# Patient Record
Sex: Male | Born: 1960 | Race: White | Hispanic: No | Marital: Married | Smoking: Former smoker
Health system: Southern US, Community
[De-identification: ages and names within clinical notes are randomized; demographics above are authoritative.]

## PROBLEM LIST (undated history)

## (undated) DIAGNOSIS — I10 Essential (primary) hypertension: Secondary | ICD-10-CM

## (undated) DIAGNOSIS — K219 Gastro-esophageal reflux disease without esophagitis: Secondary | ICD-10-CM

## (undated) DIAGNOSIS — N200 Calculus of kidney: Secondary | ICD-10-CM

## (undated) DIAGNOSIS — M1712 Unilateral primary osteoarthritis, left knee: Secondary | ICD-10-CM

## (undated) DIAGNOSIS — T7840XA Allergy, unspecified, initial encounter: Secondary | ICD-10-CM

## (undated) DIAGNOSIS — E785 Hyperlipidemia, unspecified: Secondary | ICD-10-CM

## (undated) DIAGNOSIS — J189 Pneumonia, unspecified organism: Secondary | ICD-10-CM

## (undated) DIAGNOSIS — M1990 Unspecified osteoarthritis, unspecified site: Secondary | ICD-10-CM

## (undated) HISTORY — DX: Essential (primary) hypertension: I10

## (undated) HISTORY — DX: Gastro-esophageal reflux disease without esophagitis: K21.9

## (undated) HISTORY — DX: Allergy, unspecified, initial encounter: T78.40XA

## (undated) HISTORY — DX: Hyperlipidemia, unspecified: E78.5

## (undated) HISTORY — DX: Unspecified osteoarthritis, unspecified site: M19.90

## (undated) HISTORY — DX: Calculus of kidney: N20.0

## (undated) HISTORY — PX: CARDIAC CATHETERIZATION: SHX172

## (undated) HISTORY — PX: FOOT NEUROMA SURGERY: SHX646

---
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
## 1979-12-28 HISTORY — PX: NASAL SEPTUM SURGERY: SHX37

## 1997-12-16 ENCOUNTER — Encounter: Payer: Self-pay | Admitting: Gastroenterology

## 2003-02-05 ENCOUNTER — Encounter: Admission: RE | Admit: 2003-02-05 | Discharge: 2003-02-05 | Payer: Self-pay | Admitting: Chiropractic Medicine

## 2003-02-05 ENCOUNTER — Encounter: Payer: Self-pay | Admitting: Chiropractic Medicine

## 2003-02-06 ENCOUNTER — Encounter: Admission: RE | Admit: 2003-02-06 | Discharge: 2003-02-06 | Payer: Self-pay | Admitting: Chiropractic Medicine

## 2003-02-06 ENCOUNTER — Encounter: Payer: Self-pay | Admitting: Chiropractic Medicine

## 2004-03-14 ENCOUNTER — Observation Stay (HOSPITAL_COMMUNITY): Admission: AD | Admit: 2004-03-14 | Discharge: 2004-03-15 | Payer: Self-pay | Admitting: *Deleted

## 2004-06-08 ENCOUNTER — Observation Stay (HOSPITAL_COMMUNITY): Admission: EM | Admit: 2004-06-08 | Discharge: 2004-06-09 | Payer: Self-pay | Admitting: Emergency Medicine

## 2004-06-09 ENCOUNTER — Encounter (INDEPENDENT_AMBULATORY_CARE_PROVIDER_SITE_OTHER): Payer: Self-pay | Admitting: *Deleted

## 2005-12-27 HISTORY — PX: APPENDECTOMY: SHX54

## 2006-11-19 ENCOUNTER — Emergency Department (HOSPITAL_COMMUNITY): Admission: EM | Admit: 2006-11-19 | Discharge: 2006-11-19 | Payer: Self-pay | Admitting: Emergency Medicine

## 2007-10-02 ENCOUNTER — Ambulatory Visit: Payer: Self-pay | Admitting: Gastroenterology

## 2008-03-05 DIAGNOSIS — K219 Gastro-esophageal reflux disease without esophagitis: Secondary | ICD-10-CM

## 2008-03-05 DIAGNOSIS — K648 Other hemorrhoids: Secondary | ICD-10-CM | POA: Insufficient documentation

## 2008-03-05 DIAGNOSIS — J309 Allergic rhinitis, unspecified: Secondary | ICD-10-CM

## 2008-03-05 DIAGNOSIS — E7800 Pure hypercholesterolemia, unspecified: Secondary | ICD-10-CM | POA: Insufficient documentation

## 2008-03-05 DIAGNOSIS — E785 Hyperlipidemia, unspecified: Secondary | ICD-10-CM | POA: Insufficient documentation

## 2009-03-07 ENCOUNTER — Ambulatory Visit: Payer: Self-pay | Admitting: Diagnostic Radiology

## 2009-03-07 ENCOUNTER — Emergency Department (HOSPITAL_BASED_OUTPATIENT_CLINIC_OR_DEPARTMENT_OTHER): Admission: EM | Admit: 2009-03-07 | Discharge: 2009-03-08 | Payer: Self-pay | Admitting: Emergency Medicine

## 2009-03-13 ENCOUNTER — Emergency Department (HOSPITAL_BASED_OUTPATIENT_CLINIC_OR_DEPARTMENT_OTHER): Admission: EM | Admit: 2009-03-13 | Discharge: 2009-03-13 | Payer: Self-pay | Admitting: Emergency Medicine

## 2009-03-13 ENCOUNTER — Ambulatory Visit: Payer: Self-pay | Admitting: Interventional Radiology

## 2010-02-11 ENCOUNTER — Emergency Department (HOSPITAL_COMMUNITY): Admission: EM | Admit: 2010-02-11 | Discharge: 2010-02-11 | Payer: Self-pay | Admitting: Emergency Medicine

## 2011-03-17 LAB — URINALYSIS, ROUTINE W REFLEX MICROSCOPIC
Ketones, ur: NEGATIVE mg/dL
Protein, ur: NEGATIVE mg/dL
Urobilinogen, UA: 0.2 mg/dL (ref 0.0–1.0)

## 2011-03-17 LAB — URINE MICROSCOPIC-ADD ON

## 2011-04-08 LAB — BASIC METABOLIC PANEL
BUN: 17 mg/dL (ref 6–23)
Calcium: 9.1 mg/dL (ref 8.4–10.5)
Chloride: 101 mEq/L (ref 96–112)
GFR calc Af Amer: >60 mL/min (ref >60)
GFR calc non Af Amer: >60 mL/min (ref >60)
GFR calc non Af Amer: >60 mL/min (ref >60)
Glucose, Bld: 167 mg/dL — ABNORMAL HIGH (ref 70–99)
Glucose, Bld: 143 mg/dL — ABNORMAL HIGH (ref 70–99)
Potassium: 3.1 mEq/L — ABNORMAL LOW (ref 3.5–5.1)
Potassium: 3.8 mEq/L (ref 3.5–5.1)
Sodium: 140 mEq/L (ref 135–145)
Sodium: 141 mEq/L (ref 135–145)

## 2011-04-08 LAB — URINALYSIS, ROUTINE W REFLEX MICROSCOPIC
Bilirubin Urine: NEGATIVE
Bilirubin Urine: NEGATIVE
Glucose, UA: NEGATIVE mg/dL
Ketones, ur: NEGATIVE mg/dL
Nitrite: NEGATIVE
Protein, ur: NEGATIVE mg/dL
Specific Gravity, Urine: 1.021 (ref 1.005–1.030)
Urobilinogen, UA: 0.2 mg/dL (ref 0.0–1.0)
pH: 6.5 (ref 5.0–8.0)
pH: 5.5 (ref 5.0–8.0)

## 2011-04-08 LAB — CBC
HCT: 42.8 % (ref 39.0–52.0)
HCT: 45.6 % (ref 39.0–52.0)
Hemoglobin: 14.6 g/dL (ref 13.0–17.0)
Hemoglobin: 15.6 g/dL (ref 13.0–17.0)
MCV: 91.3 fL (ref 78.0–100.0)
Platelets: 251 10*3/uL (ref 150–400)
RBC: 4.68 MIL/uL (ref 4.22–5.81)
RDW: 11.8 % (ref 11.5–15.5)
WBC: 17.1 10*3/uL — ABNORMAL HIGH (ref 4.0–10.5)
WBC: 9.9 10*3/uL (ref 4.0–10.5)

## 2011-04-08 LAB — DIFFERENTIAL
Basophils Absolute: 0.2 10*3/uL — ABNORMAL HIGH (ref 0.0–0.1)
Eosinophils Absolute: 0 10*3/uL (ref 0.0–0.7)
Eosinophils Relative: 0 % (ref 0–5)
Eosinophils Relative: 0 % (ref 0–5)
Lymphocytes Relative: 14 % (ref 12–46)
Lymphocytes Relative: 12 % (ref 12–46)
Lymphs Abs: 2.4 10*3/uL (ref 0.7–4.0)
Lymphs Abs: 1.2 10*3/uL (ref 0.7–4.0)
Monocytes Absolute: 0.5 10*3/uL (ref 0.1–1.0)
Monocytes Relative: 4 % (ref 3–12)
Neutro Abs: 13.8 10*3/uL — ABNORMAL HIGH (ref 1.7–7.7)

## 2011-04-08 LAB — URINE MICROSCOPIC-ADD ON

## 2011-05-11 NOTE — Assessment & Plan Note (Signed)
[facility]                         GASTROENTEROLOGY OFFICE NOTE   [name], [name]                      MRN:          [id number]  DATE:10/02/2007                            DOB:          [birth year]    PRIMARY CARE PHYSICIAN:  Brandon King, M.D.   GI PHYSICIAN:  Brandon Lick. Russella Dar, MD, Brandon King   PROBLEM:  Rectal pain.   HISTORY:  Brandon King is a pleasant 50 year old white male, known remotely to  Dr.  Russella King, who apparently had colonoscopy in 1998. I do not have copy  of those records available today. His current problem is that of acute  rectal pain. He said he woke up in the middle of the night last night  with abdominal cramping and discomfort and then had a couple of bowel  movements, went back to sleep and had some rectal discomfort, further  bowel movements which were somewhat hard and then developed fairly  severe rectal pain. He said he soaked in the tub from 8 o'clock to 10  o'clock this morning, but has been quite uncomfortable. He said it hurts  to sit and to walk and obviously especially with bowel movements. He has  not noted any melena or hematochezia. He says that he has a protrusion  from his rectum which is the size of his finger and quite tender.   CURRENT MEDICATIONS:  1. Altace 10 mg daily.  2. Vytorin 10/40 daily.  3. Mobic p.r.n.   ALLERGIES:  SULFA WHICH CAUSES BLISTERS.   PAST MEDICAL HISTORY:  1. Hyperlipidemia.  2. He had an appendectomy about five years ago.  3. Repair of a deviated septum in 1981.  4. Tonsillectomy in 1970.   SOCIAL HISTORY:  The patient is married. Lives with his wife and son. He  is a Warehouse manager. He is a nonsmoker. Drinks alcohol minimally  socially, 6 pack per month if that.   REVIEW OF SYSTEMS:  Pertinent for allergy sinus symptoms. Arthritic  symptoms. GI as outlined above and otherwise review of systems is  completely negative.   PHYSICAL EXAMINATION:  Well-developed white male in  no acute distress.  Height is 5 feet, 10 inches. Weight is 188. Blood pressure 130/80, pulse  68.  HEENT: Unremarkable.  CARDIOVASCULAR: Regular rate and rhythm with S1 and S2.  PULMONARY: Clear to A&P.  ABDOMEN: Soft, nontender. Bowel sounds are active.  RECTAL: Reveals a swollen external hemorrhoid acutely inflamed, but not  definitely thrombosed.   IMPRESSION:  Acute rectal pain secondary to external hemorrhoid, acute.   PLAN:  1. Sitz baths four times daily over the next couple of days.  2. Analpram cream 2.5% at least q.i.d.  3. Stool softener samples given of Colace for use over the next one      week p.r.n.  4. Vicodin 5/500 one every 6 hours as needed for pain.  5. The patient was asked to call back in 24 hours if his symptoms have      not significantly improved, will refer to surgery for treatment of      thrombosed hemorrhoid.  Brandon Gip, PA-C  Electronically Signed      Brandon King. Arlyce Dice, MD,FACG  Electronically Signed   AE/MedQ  DD: 10/02/2007  DT: 10/02/2007  Job #: [id number]

## 2011-05-14 NOTE — H&P (Signed)
NAME:  Brandon King, Brandon King NO.:  [id number]   MEDICAL RECORD NO.:  [id number]                   PATIENT TYPE:  INP   LOCATION:  1824                                 FACILITY:  [facility]   PHYSICIAN:  Gabrielle Dare. Janee Morn, M.D.             DATE OF BIRTH:  [birth year]   DATE OF ADMISSION:  06/08/2004  DATE OF DISCHARGE:                                HISTORY & PHYSICAL   CHIEF COMPLAINT:  Right lower quadrant pain.   HISTORY OF PRESENT ILLNESS:  The patient is a 50 year old white male, with a  history of hypertension, who complains of right lower quadrant abdominal  pain since 1 p.m.  He describes it starting as dull but has become  progressively more sharp.  It remains localized to his right lower quadrant.  He notes a very similar episode eight years ago for which he was evaluated  by Dr. Joni Fears D. Young from our practice.  That episode was self-limiting  and it resolved without any surgery.  He was advised at that time if it  recurred he would need his appendix removed.  Now, the patient continues to  complain of some sharp pain in his right lower quadrant.   Workup in the [facility] Emergency Department included a  white blood cell count of 15.6 and CT scan of the abdomen and pelvis which  is consistent with acute appendicitis.   PAST MEDICAL HISTORY:  1. Hypertension.  2. Hypercholesterolemia.   PAST SURGICAL HISTORY:  1. Left wrist surgery.  2. tonsillectomy.  3. Cardiac catheterization which demonstrated no significant occlusion.   MEDICATIONS:  1. Altace 10 mg p.o. q.d.  2. Vytorin for cholesterol.   ALLERGIES:  SULFA and CODEINE.   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  He drinks about two  beers per week.   REVIEW OF SYMPTOMS:  GENERAL:  Negative for except for as mentioned.  PULMONARY:  Negative.  GASTROINTESTINAL:  See the history of present  illness.  GENITOURINARY:  Negative.  MUSCULOSKELETAL:  Negative.   The  remainder of the review of systems is negative.   PHYSICAL EXAMINATION:  VITAL SIGNS:  Blood pressure 125/73, temperature  97.8, pulse 67, respirations 18.  GENERAL:  He is awake, alert, and well-appearing.  HEENT:  Pupils are equally reactive.  Sclerae is nonicteric.  NECK:  Supple with no masses.  LUNGS:  Clear to auscultation with normal respiratory excursion.  HEART:  Regular rate and rhythm.  PMI is palpable on the left chest.  PULSES:  Distal pulses are 2+ and equal bilaterally.  ABDOMEN:  Soft and nondistended.  He has a small easily reducible umbilical  hernia.  He has significant tenderness with voluntary guarding in his right  lower quadrant.  No other  masses are noted.  There is no  hepatosplenomegaly.  SKIN:  Warm, dry and intact.  EXTREMITIES:  No  significant edema.   LABORATORY DATA:  White blood cell count 15.6, hemoglobin 14.1, hematocrit  41, platelets 259,000.  Sodium 139, potassium 3.6, chloride 104, BUN 14,  glucose 81.  Urinalysis was unremarkable except for some ketones present.  CT scan was as described above.   IMPRESSION:  Acute appendicitis.   PLAN:  The plan will be to take the patient to the operating room for  laparoscopic appendectomy.  The procedure, risks, and benefits were  discussed in detail with the patient who agrees to proceed.  He wife is also  present and we will give him some antibiotics preoperatively.                                                Gabrielle Dare Janee Morn, M.D.    BET/MEDQ  D:  06/08/2004  T:  06/08/2004  Job:  [id number]

## 2011-05-14 NOTE — Discharge Summary (Signed)
NAME:  RAZIEL, KOENIGS                         ACCOUNT NO.:  [id number]   MEDICAL RECORD NO.:  [id number]                   PATIENT TYPE:  INP   LOCATION:  5009                                 FACILITY:  [facility]   PHYSICIAN:  Gabrielle Dare. Janee Morn, M.D.             DATE OF BIRTH:  17-Oct-1961   DATE OF ADMISSION:  06/08/2004  DATE OF DISCHARGE:  06/09/2004                                 DISCHARGE SUMMARY   DISCHARGE DIAGNOSES:  1. Acute appendicitis.  2. Status post laparoscopic appendectomy.   HISTORY OF PRESENT ILLNESS:  The patient is a 50 year old white male who  presented to the emergency department last night with complaint of right  lower quadrant pain since 1 p.m. yesterday.  He was evaluated in the  emergency department and workup was consistent with acute appendicitis.  He  was admitted and taken to the operating room.   HOSPITAL COURSE:  The patient underwent an uncomplicated laparoscopic  appendectomy.  His appendix was not perforated.  Postoperatively he remained  afebrile, hemodynamically stable.  He had good control of his pain and  tolerated gradual advancement of his diet and he is discharged home in  stable condition on postoperative day #1.   DIET:  Regular.   ACTIVITY:  No lifting for six weeks.   DISCHARGE MEDICATIONS:  1. Percocet 5/325 one to two p.o. q.6h. p.r.n. pain.  2. Patient is also to continue  his home medications including:     a. Altace 10 mg p.o. daily.     b. Cholesterol medication.   FOLLOW UP:  Follow-up is with myself in three weeks.                                                Gabrielle Dare Janee Morn, M.D.    BET/MEDQ  D:  06/09/2004  T:  06/09/2004  Job:  [id number]

## 2011-05-14 NOTE — H&P (Signed)
NAME:  Brandon King, Brandon King                         ACCOUNT NO.:  [id number]   MEDICAL RECORD NO.:  [id number]                   PATIENT TYPE:  INP   LOCATION:  3034                                 FACILITY:  [facility]   PHYSICIAN:  Evelena Peat, M.D.               DATE OF BIRTH:  [birth year]   DATE OF ADMISSION:  03/14/2004  DATE OF DISCHARGE:  03/15/2004                                HISTORY & PHYSICAL   PRIMARY CARE PHYSICIAN:  Marjory Lies, M.D.   CHIEF COMPLAINT:  I passed out.   HISTORY OF PRESENT ILLNESS:  This is a 50 year old married white male with a  history of hypertension and hyperlipidemia, who was doing well until around  2 a.m. today when he had the onset of nausea and vomiting.  Around 2:30  a.m., he went to the bathroom to have a bowel movement.  While sitting on  the toilet, he became diaphoretic, nauseated, and felt dizzy and  subsequently had a syncopal episode.  His wife heard the commotion and found  him on the bathroom floor bleeding from a facial laceration.  At that point,  he was conscious and verbal.  There had been no reported stool or urinary  incontinence.  He was transported by EMS here for further evaluation and  noted to be orthostatic.  Head CT was obtained, which was negative.  Patient  received approximately 1.5 liters of IV fluids with normal saline and was  being prepared for discharge when he stood up and became extremely  orthostatic with blood pressure dropping to 80/30.  He was now admitted for  further observation and fluids.  He has no history of any seizure disorder  and has not had any recent complaints of chest pain.   PAST MEDICAL HISTORY:  1. Hypertension.  2. Hyperlipidemia.   ALLERGIES:  SULFA.   MEDICATIONS:  1. Altace 10 mg a day.  2. Vitorin 40/10 1 a day.   PAST SURGICAL HISTORY:  1. Tonsillectomy.  2. Left wrist surgery.  3. Deviated nasal septal surgery in 1981.   SOCIAL HISTORY:  He is married.  He has a 18-month-old  adopted son.  He  smokes cigars.  Rare alcohol use.  Works as a Nutritional therapist.   FAMILY HISTORY:  Mother died at age 73 of complications of coronary disease.  Father died at age 52 of coronary disease.  He had a paternal grandfather  who died at age 87 of coronary disease.  He had a brother who died at age 35  of coronary disease.   REVIEW OF SYSTEMS:  He has had one episode of nonbloody diarrhea since  arrival here.  He had a headache initially after the fall but none now.  He  denies any recent sore throat, cough, dyspnea, chest pain, abdominal pain,  or dysuria.   OBJECTIVE:  VITAL SIGNS:  Temperature 99.4, blood pressure supine 123/65  with pulse of  75.  Standing blood pressure 111/60 and pulse of 87.  Respirations are 16-18.  GENERAL:  Patient is alert in no apparent distress.  Lying supine.  HEENT:  He has a laceration to the right frontal area, which was glued by  the ED staff.  He appears to have a small, soft hematoma underlying the  laceration but no evidence for head trauma otherwise.  Pupils are equal,  round and reactive to light.  TMs are normal.  Oropharynx is moist and  clear.  NECK:  Supple without mass.  No spinal tenderness.  LUNGS:  Clear to auscultation.  HEART:  Regular rate and rhythm without murmur.  ABDOMEN:  Normal bowel sounds.  Soft, nontender without hepatosplenomegaly.  EXTREMITIES:  Pulses 2+ throughout.  No edema.  NEUROLOGIC:  Cranial nerves II-XII are normal.  He has no focal strength  deficits.   LABS:  White count is 14.4 thousand with 88% neutrophils.  Hemoglobin 15.1.  CK-MB and troponin levels were normal.  Sodium 140, potassium 4.0.   CT of the head is normal.   EKG:  No acute changes.   IMPRESSION:  A 50 year old white male with probable viral gastroenteritis.  Suspect this syncopal episode is related to vasovagal episode and  orthostatic hypotension.  There is no suspicion for seizure activity or  cardiac arrhythmia.  He has remained  orthostatic, though improved after two  liters of IV fluids.   PLAN:  Would admit for further IV fluids and antiemetics p.r.n.  We will  hold his Altace.  Will continue to monitor orthostatic blood pressures and  make sure that he is able to fully ambulate before discharge.  Hopefully, he  will be a 24-hour observation admission.                                                Evelena Peat, M.D.    BB/MEDQ  D:  03/14/2004  T:  03/16/2004  Job:  [id number]   cc:   Marjory Lies, M.D.  [address]  [place]  [place] [address]  Fax: [phone]

## 2011-05-14 NOTE — Op Note (Signed)
NAME:  SAMANTHA, OLIVERA                         ACCOUNT NO.:  [id number]   MEDICAL RECORD NO.:  [id number]                   PATIENT TYPE:  INP   LOCATION:  5009                                 FACILITY:  [facility]   PHYSICIAN:  Gabrielle Dare. Janee Morn, M.D.             DATE OF BIRTH:  1961/12/11   DATE OF PROCEDURE:  06/09/2004  DATE OF DISCHARGE:                                 OPERATIVE REPORT   PREOPERATIVE DIAGNOSIS:  Acute appendicitis.   POSTOPERATIVE DIAGNOSES:  Acute appendicitis.   PROCEDURE:  Laparoscopic appendectomy.   SURGEON:  Violeta Gelinas, M.D.   ANESTHESIA:  General.   HISTORY OF PRESENT ILLNESS:  The patient is a 50 year old white male, who  presented to the emergency department with complaints of right lower  quadrant pain similar to an episode of self-limiting appendicitis that he  had 8 years ago.  Work-up in the emergency department included white blood  cell count which was 15.6 and CT scan which was most consistent with acute  appendicitis.  His physical exam concurred, and he is brought to the  operating room for an appendectomy.   PROCEDURE IN DETAIL:  Informed consent was obtained.  The patient received  intravenous antibiotics.  He was brought up to the operating room.  General  anesthesia was administered.  His abdomen was prepped and draped in a  sterile fashion.  Of note, the patient also has small, spontaneously  reducing umbilical hernia.  An infraumbilical incision was made.  Subcutaneous tissue was dissected down.  His umbilical hernia was  circumferentially dissected, and the omentum which it contained was reduced  back into the abdominal cavity.  The peritoneal cavity was then entered  under direct vision without difficulty.  A 0 Vicryl pursestring suture was  placed around the fascial opening.  The Hasson trocar was inserted through  the hernia defect, and the abdomen was insufflated with carbon dioxide under  direct vision.  The 5 mm right upper  quadrant and a 12 mm left lower  quadrant trocars were inserted.  At this time, the cecum was retracted  medially.  The appendix was noted to be retrocecal and a bit lateral.  Some  of the lateral peritoneal attachments to the cecum were taken down with  care, and the appendix was revealed.  Once this was adequately dissected  free, the base was nicely visualized.  The mesoappendix was then dissected  away from the base, and the mesoappendix was divided with an endoscopic GIA  stapler with a vascular load.  This took 2 firings of the GIA and, at this  time, the base was cleared off, and then the base was divided with an  endoscopic GIA with a vascular load stapler.  The appendix was placed in an  EndoCatch bag and was acutely inflamed but not perforated, and it was  removed from the abdomen via the left lower quadrant port site.  The  abdomen  was copiously irrigated with 2 L of saline.  The staple lines were  rechecked.  Excellent hemostasis was ensured and subsequently the trocars  were removed under direct vision.  The Hasson was removed, and the  pneumoperitoneum was released.  The umbilical defect was first closed by  tying the 0 Vicryl pursestring suture.  Subsequently, further score to the  hernia repair was done with interrupted 0 Prolene sutures.  Once that was  accomplished, the umbilicus was tacked back down to the fascia with  interrupted 3-0 Vicryl sutures.  All three incisions were copiously  irrigated.  Marcaine 0.25% with epinephrine was used for local anesthetic,  and the skin region was closed with running 4-0 Vicryl  subcuticular stitch.  Sponge, needle, and instrument counts were correct.  Benzoin and Steri-Strips and sterile dressings were applied.  The patient  tolerated the procedure without apparent complication and was taken to the  recovery room in stable condition.                                               Gabrielle Dare Janee Morn, M.D.    BET/MEDQ  D:   06/09/2004  T:  06/09/2004  Job:  [id number]

## 2012-04-10 ENCOUNTER — Encounter: Payer: Self-pay | Admitting: Gastroenterology

## 2012-04-25 ENCOUNTER — Ambulatory Visit (AMBULATORY_SURGERY_CENTER): Payer: BC Managed Care – PPO | Admitting: *Deleted

## 2012-04-25 VITALS — Ht 69.5 in | Wt 176.0 lb

## 2012-04-25 DIAGNOSIS — Z1211 Encounter for screening for malignant neoplasm of colon: Secondary | ICD-10-CM

## 2012-04-25 MED ORDER — PEG-KCL-NACL-NASULF-NA ASC-C 100 G PO SOLR: ORAL | Status: DC

## 2012-05-09 ENCOUNTER — Ambulatory Visit (AMBULATORY_SURGERY_CENTER): Payer: BC Managed Care – PPO | Admitting: Gastroenterology

## 2012-05-09 ENCOUNTER — Encounter: Payer: Self-pay | Admitting: Gastroenterology

## 2012-05-09 VITALS — BP 146/71 | HR 59 | Temp 96.1°F | Resp 18 | Ht 69.0 in | Wt 176.0 lb

## 2012-05-09 DIAGNOSIS — Z1211 Encounter for screening for malignant neoplasm of colon: Secondary | ICD-10-CM

## 2012-05-09 MED ORDER — SODIUM CHLORIDE 0.9 % IV SOLN: 500.0000 mL | INTRAVENOUS | Status: DC

## 2012-05-09 NOTE — Op Note (Signed)
[facility] [address] [place], [place]  [address]  COLONOSCOPY PROCEDURE REPORT  PATIENT:  Brandon King, Brandon King  MR#:  [id number] BIRTHDATE:  29-Jul-1961, 51 yrs. old  GENDER:  male ENDOSCOPIST:  Judie Petit T. Russella Dar, MD, [facility]  PROCEDURE DATE:  05/09/2012 PROCEDURE:  Colonoscopy 78469 ASA CLASS:  Class II INDICATIONS:  1) Routine Risk Screening MEDICATIONS:   MAC sedation, administered by CRNA, propofol (Diprivan) 180 mg IV DESCRIPTION OF PROCEDURE:   After the risks benefits and alternatives of the procedure were thoroughly explained, informed consent was obtained.  Digital rectal exam was performed and revealed no abnormalities.   The LB CF-H180AL [id number] endoscope was introduced through the anus and advanced to the cecum, which was identified by both the appendix and ileocecal valve, without limitations.  The quality of the prep was excellent, using MoviPrep.  The instrument was then slowly withdrawn as the colon was fully examined. <<PROCEDUREIMAGES>> FINDINGS:  A normal appearing cecum, ileocecal valve, and appendiceal orifice were identified. The ascending, hepatic flexure, transverse, splenic flexure, descending, sigmoid colon, and rectum appeared unremarkable. Retroflexed views in the rectum revealed small internal hemorrhoids, hypertrophied anal papillae. The time to cecum =  1.75  minutes. The scope was then withdrawn (time =  8.75  min) from the patient and the procedure completed.  COMPLICATIONS:  None  ENDOSCOPIC IMPRESSION: 1) Normal colon 2) Internal hemorrhoids 3) Hypertrophied anal papillae  RECOMMENDATIONS: 1) Continue current colorectal screening for "routine risk" patients with a repeat colonoscopy in 10 years.  Venita Lick. Russella Dar, MD, [facility]  CC:  Marjory Lies, MD  n. Rosalie DoctorVenita Lick. Brieanna Nau at 05/09/2012 09:17 AM  [place] Ram, [phone]

## 2012-05-09 NOTE — Progress Notes (Signed)
Patient did not have preoperative order for IV antibiotic SSI prophylaxis. (G8918)  Patient did not experience any of the following events: a burn prior to discharge; a fall within the facility; wrong site/side/patient/procedure/implant event; or a hospital transfer or hospital admission upon discharge from the facility. (G8907)  

## 2012-05-09 NOTE — Patient Instructions (Signed)
YOU HAD AN ENDOSCOPIC PROCEDURE TODAY AT THE [facility]: Refer to the procedure report that was given to you for any specific questions about what was found during the examination.  If the procedure report does not answer your questions, please call your gastroenterologist to clarify.  If you requested that your care partner not be given the details of your procedure findings, then the procedure report has been included in a sealed envelope for you to review at your convenience later.  YOU SHOULD EXPECT: Some feelings of bloating in the abdomen. Passage of more gas than usual.  Walking can help get rid of the air that was put into your GI tract during the procedure and reduce the bloating. If you had a lower endoscopy (such as a colonoscopy or flexible sigmoidoscopy) you may notice spotting of blood in your stool or on the toilet paper. If you underwent a bowel prep for your procedure, then you may not have a normal bowel movement for a few days.  DIET: Your first meal following the procedure should be a light meal and then it is ok to progress to your normal diet.  A half-sandwich or bowl of soup is an example of a good first meal.  Heavy or fried foods are harder to digest and may make you feel nauseous or bloated.  Likewise meals heavy in dairy and vegetables can cause extra gas to form and this can also increase the bloating.  Drink plenty of fluids but you should avoid alcoholic beverages for 24 hours.  ACTIVITY: Your care partner should take you home directly after the procedure.  You should plan to take it easy, moving slowly for the rest of the day.  You can resume normal activity the day after the procedure however you should NOT DRIVE or use heavy machinery for 24 hours (because of the sedation medicines used during the test).    SYMPTOMS TO REPORT IMMEDIATELY: A gastroenterologist can be reached at any hour.  During normal business hours, 8:30 AM to 5:00 PM Monday through Friday,  call [phone].  After hours and on weekends, please call the GI answering service at [phone] who will take a message and have the physician on call contact you.   Following lower endoscopy (colonoscopy or flexible sigmoidoscopy):  Excessive amounts of blood in the stool  Significant tenderness or worsening of abdominal pains  Swelling of the abdomen that is new, acute  Fever of 100F or higher    FOLLOW UP: If any biopsies were taken you will be contacted by phone or by letter within the next 1-3 weeks.  Call your gastroenterologist if you have not heard about the biopsies in 3 weeks.  Our staff will call the home number listed on your records the next business day following your procedure to check on you and address any questions or concerns that you may have at that time regarding the information given to you following your procedure. This is a courtesy call and so if there is no answer at the home number and we have not heard from you through the emergency physician on call, we will assume that you have returned to your regular daily activities without incident.  SIGNATURES/CONFIDENTIALITY: You and/or your care partner have signed paperwork which will be entered into your electronic medical record.  These signatures attest to the fact that that the information above on your After Visit Summary has been reviewed and is understood.  Full responsibility of the confidentiality   of this discharge information lies with you and/or your care-partner.     

## 2012-05-10 ENCOUNTER — Telehealth: Payer: Self-pay | Admitting: *Deleted

## 2012-05-10 NOTE — Telephone Encounter (Signed)
  Follow up Call-  Call back number 05/09/2012  Post procedure Call Back phone  # [phone]  Permission to leave phone message Yes     Left message.

## 2013-07-10 ENCOUNTER — Ambulatory Visit: Payer: BC Managed Care – PPO | Admitting: Neurology

## 2014-04-01 ENCOUNTER — Inpatient Hospital Stay (HOSPITAL_COMMUNITY)
Admission: RE | Admit: 2014-04-01 | Discharge: 2014-04-01 | Disposition: A | Discharge status: Home / Self Care | Payer: BC Managed Care – PPO | Source: Ambulatory Visit

## 2014-04-01 NOTE — Pre-Procedure Instructions (Signed)
Brandon EvenerJames E King  04/01/2014   Your procedure is scheduled on:  Tuesday, April 14th  Report to Admitting at 0830 AM.  Call this number if you have problems the morning of surgery: [phone]   Remember:   Do not eat food or drink liquids after midnight.   Take these medicines the morning of surgery with A SIP OF WATER:    Do not wear jewelry.  Do not wear lotions, powders, or perfumes. You may wear deodorant.  Do not shave 48 hours prior to surgery. Men may shave face and neck.  Do not bring valuables to the hospital.  [facility] not responsible   for any belongings or valuables.               Contacts, dentures or bridgework may not be worn into surgery.  Leave suitcase in the car. After surgery it may be brought to your room.  For patients admitted to the hospital, discharge time is determined by your treatment team.               Patients discharged the day of surgery will not be allowed to drive home.  Please read over the following fact sheets that you were given: Pain Booklet, Coughing and Deep Breathing and Surgical Site Infection Prevention [facility] - Preparing for Surgery  Before surgery, you can play an important role.  Because skin is not sterile, your skin needs to be as free of germs as possible.  You can reduce the number of germs on you skin by washing with CHG (chlorahexidine gluconate) soap before surgery.  CHG is an antiseptic cleaner which kills germs and bonds with the skin to continue killing germs even after washing.  Please DO NOT use if you have an allergy to CHG or antibacterial soaps.  If your skin becomes reddened/irritated stop using the CHG and inform your nurse when you arrive at Short Stay.  Do not shave (including legs and underarms) for at least 48 hours prior to the first CHG shower.  You may shave your face.  Please follow these instructions carefully:   1.  Shower with CHG Soap the night before surgery and the morning of Surgery.  2.  If  you choose to wash your hair, wash your hair first as usual with your normal shampoo.  3.  After you shampoo, rinse your hair and body thoroughly to remove the shampoo.  4.  Use CHG as you would any other liquid soap.  You can apply CHG directly to the skin and wash gently with scrungie or a clean washcloth.  5.  Apply the CHG Soap to your body ONLY FROM THE NECK DOWN.  Do not use on open wounds or open sores.  Avoid contact with your eyes, ears, mouth and genitals (private parts).  Wash genitals (private parts) with your normal soap.  6.  Wash thoroughly, paying special attention to the area where your surgery will be performed.  7.  Thoroughly rinse your body with warm water from the neck down.  8.  DO NOT shower/wash with your normal soap after using and rinsing off the CHG Soap.  9.  Pat yourself dry with a clean towel.            10.  Wear clean pajamas.            11.  Place clean sheets on your bed the night of your first shower and do not sleep with pets.  Day  of Surgery  Do not apply any lotions/deoderants the morning of surgery.  Please wear clean clothes to the hospital/surgery center.

## 2014-04-04 ENCOUNTER — Other Ambulatory Visit: Payer: Self-pay | Admitting: Orthopedic Surgery

## 2014-04-08 ENCOUNTER — Encounter (HOSPITAL_COMMUNITY): Payer: Self-pay

## 2014-04-08 ENCOUNTER — Ambulatory Visit (HOSPITAL_COMMUNITY)
Admission: RE | Admit: 2014-04-08 | Discharge: 2014-04-08 | Disposition: A | Discharge status: Home / Self Care | Payer: BC Managed Care – PPO | Source: Ambulatory Visit | Attending: Anesthesiology | Admitting: Anesthesiology

## 2014-04-08 ENCOUNTER — Encounter (HOSPITAL_COMMUNITY)
Admission: RE | Admit: 2014-04-08 | Discharge: 2014-04-08 | Disposition: A | Discharge status: Home / Self Care | Payer: BC Managed Care – PPO | Source: Ambulatory Visit | Attending: Orthopedic Surgery | Admitting: Orthopedic Surgery

## 2014-04-08 DIAGNOSIS — Z01812 Encounter for preprocedural laboratory examination: Secondary | ICD-10-CM

## 2014-04-08 DIAGNOSIS — X58XXXA Exposure to other specified factors, initial encounter: Secondary | ICD-10-CM

## 2014-04-08 DIAGNOSIS — Z01818 Encounter for other preprocedural examination: Secondary | ICD-10-CM | POA: Insufficient documentation

## 2014-04-08 DIAGNOSIS — IMO0002 Reserved for concepts with insufficient information to code with codable children: Secondary | ICD-10-CM | POA: Insufficient documentation

## 2014-04-08 HISTORY — DX: Pneumonia, unspecified organism: J18.9

## 2014-04-08 LAB — BASIC METABOLIC PANEL
BUN: 18 mg/dL (ref 6–23)
CO2: 28 mEq/L (ref 19–32)
Calcium: 10 mg/dL (ref 8.4–10.5)
Chloride: 103 mEq/L (ref 96–112)
Creatinine, Ser: 1.08 mg/dL (ref 0.50–1.35)
GFR calc Af Amer: 88 mL/min — ABNORMAL LOW (ref >90)
GFR calc non Af Amer: 76 mL/min — ABNORMAL LOW (ref >90)
Glucose, Bld: 129 mg/dL — ABNORMAL HIGH (ref 70–99)
POTASSIUM: 4.1 meq/L (ref 3.7–5.3)
SODIUM: 144 meq/L (ref 137–147)

## 2014-04-08 LAB — SURGICAL PCR SCREEN
MRSA, PCR: NEGATIVE
STAPHYLOCOCCUS AUREUS: POSITIVE — AB

## 2014-04-08 LAB — TYPE AND SCREEN
ABO/RH(D): O POS
ANTIBODY SCREEN: NEGATIVE

## 2014-04-08 LAB — CBC
HCT: 42.3 % (ref 39.0–52.0)
HEMOGLOBIN: 14.8 g/dL (ref 13.0–17.0)
MCH: 31.6 pg (ref 26.0–34.0)
MCHC: 35 g/dL (ref 30.0–36.0)
MCV: 90.2 fL (ref 78.0–100.0)
Platelets: 226 10*3/uL (ref 150–400)
RBC: 4.69 MIL/uL (ref 4.22–5.81)
RDW: 12.6 % (ref 11.5–15.5)
WBC: 8.3 10*3/uL (ref 4.0–10.5)

## 2014-04-08 LAB — APTT: aPTT: 31 seconds (ref 24–37)

## 2014-04-08 LAB — PROTIME-INR
INR: 0.92 (ref 0.00–1.49)
Prothrombin Time: 12.2 seconds (ref 11.6–15.2)

## 2014-04-08 LAB — ABO/RH: ABO/RH(D): O POS

## 2014-04-08 MED ORDER — CEFAZOLIN SODIUM-DEXTROSE 2-3 GM-% IV SOLR
2.0000 g | INTRAVENOUS | Status: AC
  Administered 2014-04-09: 2 g via INTRAVENOUS
  Filled 2014-04-08: qty 50

## 2014-04-08 NOTE — Progress Notes (Signed)
Primary physician - dr. Doristine CounterBurnett Cardiologist - chris barber - [place] Stress echo - 3 years ago -- will attempt to request since surg tomorrow cath - 10 years ago

## 2014-04-08 NOTE — Progress Notes (Signed)
04/08/14 1508  OBSTRUCTIVE SLEEP APNEA  Have you ever been diagnosed with sleep apnea through a sleep study? No  Do you snore loudly (loud enough to be heard through closed doors)?  1  Do you often feel tired, fatigued, or sleepy during the daytime? 0  Has anyone observed you stop breathing during your sleep? 0  Do you have, or are you being treated for high blood pressure? 1  BMI more than 35 kg/m2? 0  Age over 55 years old? 1  Neck circumference greater than 40 cm/18 inches? 0  Gender: 1  Obstructive Sleep Apnea Score 4  Score 4 or greater  Results sent to PCP

## 2014-04-08 NOTE — Progress Notes (Signed)
Please treat pt DOS for positive Staph PCR result.

## 2014-04-09 ENCOUNTER — Encounter (HOSPITAL_COMMUNITY)
Admission: RE | Disposition: A | Discharge status: Home Health | Payer: Self-pay | Source: Ambulatory Visit | Attending: Orthopedic Surgery

## 2014-04-09 ENCOUNTER — Inpatient Hospital Stay (HOSPITAL_COMMUNITY): Payer: BC Managed Care – PPO | Admitting: Anesthesiology

## 2014-04-09 ENCOUNTER — Encounter (HOSPITAL_COMMUNITY): Payer: Self-pay | Admitting: *Deleted

## 2014-04-09 ENCOUNTER — Inpatient Hospital Stay (HOSPITAL_COMMUNITY): Payer: BC Managed Care – PPO

## 2014-04-09 ENCOUNTER — Inpatient Hospital Stay (HOSPITAL_COMMUNITY)
Admission: RE | Admit: 2014-04-09 | Discharge: 2014-04-11 | DRG: 470 | Disposition: A | Discharge status: Home Health | Payer: BC Managed Care – PPO | Source: Ambulatory Visit | Attending: Orthopedic Surgery | Admitting: Orthopedic Surgery

## 2014-04-09 ENCOUNTER — Encounter (HOSPITAL_COMMUNITY): Payer: BC Managed Care – PPO | Admitting: Anesthesiology

## 2014-04-09 DIAGNOSIS — Z01812 Encounter for preprocedural laboratory examination: Secondary | ICD-10-CM

## 2014-04-09 DIAGNOSIS — Z87891 Personal history of nicotine dependence: Secondary | ICD-10-CM

## 2014-04-09 DIAGNOSIS — M179 Osteoarthritis of knee, unspecified: Secondary | ICD-10-CM | POA: Diagnosis present

## 2014-04-09 DIAGNOSIS — M1712 Unilateral primary osteoarthritis, left knee: Secondary | ICD-10-CM

## 2014-04-09 DIAGNOSIS — E785 Hyperlipidemia, unspecified: Secondary | ICD-10-CM | POA: Diagnosis present

## 2014-04-09 DIAGNOSIS — Z79899 Other long term (current) drug therapy: Secondary | ICD-10-CM

## 2014-04-09 DIAGNOSIS — Z01818 Encounter for other preprocedural examination: Secondary | ICD-10-CM

## 2014-04-09 DIAGNOSIS — M1710 Unilateral primary osteoarthritis, unspecified knee: Principal | ICD-10-CM | POA: Diagnosis present

## 2014-04-09 DIAGNOSIS — Z01810 Encounter for preprocedural cardiovascular examination: Secondary | ICD-10-CM

## 2014-04-09 DIAGNOSIS — I10 Essential (primary) hypertension: Secondary | ICD-10-CM | POA: Diagnosis present

## 2014-04-09 HISTORY — PX: REPLACEMENT UNICONDYLAR JOINT KNEE: SUR1227

## 2014-04-09 HISTORY — PX: KNEE ARTHROSCOPY: SHX127

## 2014-04-09 HISTORY — DX: Unilateral primary osteoarthritis, left knee: M17.12

## 2014-04-09 HISTORY — PX: PARTIAL KNEE ARTHROPLASTY: SHX2174

## 2014-04-09 LAB — CBC
HCT: 38.8 % — ABNORMAL LOW (ref 39.0–52.0)
HEMOGLOBIN: 13.7 g/dL (ref 13.0–17.0)
MCH: 31.9 pg (ref 26.0–34.0)
MCHC: 35.3 g/dL (ref 30.0–36.0)
MCV: 90.2 fL (ref 78.0–100.0)
PLATELETS: 203 10*3/uL (ref 150–400)
RBC: 4.3 MIL/uL (ref 4.22–5.81)
RDW: 12.6 % (ref 11.5–15.5)
WBC: 13.6 10*3/uL — ABNORMAL HIGH (ref 4.0–10.5)

## 2014-04-09 LAB — CREATININE, SERUM
Creatinine, Ser: 0.94 mg/dL (ref 0.50–1.35)
GFR calc non Af Amer: >90 mL/min (ref >90)

## 2014-04-09 SURGERY — ARTHROSCOPY, KNEE
Anesthesia: General | Site: Knee | Laterality: Left

## 2014-04-09 MED ORDER — MIDAZOLAM HCL 2 MG/2ML IJ SOLN
INTRAMUSCULAR | Status: AC
  Filled 2014-04-09: qty 2

## 2014-04-09 MED ORDER — SENNA-DOCUSATE SODIUM 8.6-50 MG PO TABS: 2.0000 | ORAL_TABLET | Freq: Every day | ORAL | Status: DC

## 2014-04-09 MED ORDER — DEXAMETHASONE 6 MG PO TABS
10.0000 mg | ORAL_TABLET | Freq: Three times a day (TID) | ORAL | Status: AC
  Administered 2014-04-09 – 2014-04-10 (×2): 10 mg via ORAL
  Filled 2014-04-09 (×3): qty 1

## 2014-04-09 MED ORDER — ONDANSETRON HCL 4 MG PO TABS: 4.0000 mg | ORAL_TABLET | Freq: Four times a day (QID) | ORAL | Status: DC | PRN

## 2014-04-09 MED ORDER — BISACODYL 10 MG RE SUPP
10.0000 mg | Freq: Every day | RECTAL | Status: DC | PRN
  Filled 2014-04-09: qty 1

## 2014-04-09 MED ORDER — MIDAZOLAM HCL 2 MG/2ML IJ SOLN
1.0000 mg | Freq: Once | INTRAMUSCULAR | Status: AC
  Administered 2014-04-09: 1 mg via INTRAVENOUS

## 2014-04-09 MED ORDER — OXYCODONE HCL 5 MG PO TABS
ORAL_TABLET | ORAL | Status: AC
Start: 2014-04-09 — End: 2014-04-10
  Filled 2014-04-09: qty 2

## 2014-04-09 MED ORDER — CEFAZOLIN SODIUM-DEXTROSE 2-3 GM-% IV SOLR
2.0000 g | Freq: Four times a day (QID) | INTRAVENOUS | Status: AC
  Administered 2014-04-09 (×2): 2 g via INTRAVENOUS
  Filled 2014-04-09 (×3): qty 50

## 2014-04-09 MED ORDER — HYDROMORPHONE HCL PF 1 MG/ML IJ SOLN
INTRAMUSCULAR | Status: AC
Start: 2014-04-09 — End: 2014-04-10
  Filled 2014-04-09: qty 1

## 2014-04-09 MED ORDER — SODIUM CHLORIDE 0.9 % IR SOLN
Status: DC | PRN
  Administered 2014-04-09: 3000 mL

## 2014-04-09 MED ORDER — ENOXAPARIN SODIUM 30 MG/0.3ML ~~LOC~~ SOLN: 30.0000 mg | Freq: Two times a day (BID) | SUBCUTANEOUS | Status: DC

## 2014-04-09 MED ORDER — MENTHOL 3 MG MT LOZG: 1.0000 | LOZENGE | OROMUCOSAL | Status: DC | PRN

## 2014-04-09 MED ORDER — HYDROCHLOROTHIAZIDE 25 MG PO TABS
25.0000 mg | ORAL_TABLET | Freq: Every day | ORAL | Status: DC
  Administered 2014-04-09 – 2014-04-11 (×3): 25 mg via ORAL
  Filled 2014-04-09 (×3): qty 1

## 2014-04-09 MED ORDER — METOCLOPRAMIDE HCL 10 MG PO TABS: 5.0000 mg | ORAL_TABLET | Freq: Three times a day (TID) | ORAL | Status: DC | PRN

## 2014-04-09 MED ORDER — LIDOCAINE HCL (CARDIAC) 20 MG/ML IV SOLN
INTRAVENOUS | Status: DC | PRN
  Administered 2014-04-09: 100 mg via INTRAVENOUS

## 2014-04-09 MED ORDER — ACETAMINOPHEN 650 MG RE SUPP: 650.0000 mg | Freq: Four times a day (QID) | RECTAL | Status: DC | PRN

## 2014-04-09 MED ORDER — POTASSIUM CHLORIDE IN NACL 20-0.45 MEQ/L-% IV SOLN
INTRAVENOUS | Status: DC
  Administered 2014-04-09: 17:00:00 via INTRAVENOUS
  Filled 2014-04-09 (×5): qty 1000

## 2014-04-09 MED ORDER — MUPIROCIN 2 % EX OINT
TOPICAL_OINTMENT | Freq: Two times a day (BID) | CUTANEOUS | Status: DC
  Administered 2014-04-09: 09:00:00 via NASAL
  Filled 2014-04-09: qty 22

## 2014-04-09 MED ORDER — HYDROMORPHONE HCL 2 MG PO TABS
2.0000 mg | ORAL_TABLET | ORAL | Status: DC | PRN
Start: 2014-04-09 — End: 2014-10-28

## 2014-04-09 MED ORDER — METHOCARBAMOL 500 MG PO TABS: 500.0000 mg | ORAL_TABLET | Freq: Four times a day (QID) | ORAL | Status: DC

## 2014-04-09 MED ORDER — RAMIPRIL 10 MG PO CAPS
10.0000 mg | ORAL_CAPSULE | Freq: Every day | ORAL | Status: DC
  Administered 2014-04-09 – 2014-04-11 (×3): 10 mg via ORAL
  Filled 2014-04-09 (×3): qty 1

## 2014-04-09 MED ORDER — FENTANYL CITRATE 0.05 MG/ML IJ SOLN
INTRAMUSCULAR | Status: AC
  Administered 2014-04-09: 100 ug via INTRAVENOUS
  Filled 2014-04-09: qty 2

## 2014-04-09 MED ORDER — DOCUSATE SODIUM 100 MG PO CAPS
100.0000 mg | ORAL_CAPSULE | Freq: Two times a day (BID) | ORAL | Status: DC
  Administered 2014-04-09 – 2014-04-11 (×4): 100 mg via ORAL
  Filled 2014-04-09 (×5): qty 1

## 2014-04-09 MED ORDER — FENTANYL CITRATE 0.05 MG/ML IJ SOLN
INTRAMUSCULAR | Status: AC
  Filled 2014-04-09: qty 2

## 2014-04-09 MED ORDER — MAGNESIUM CITRATE PO SOLN: 1.0000 | Freq: Once | ORAL | Status: AC | PRN

## 2014-04-09 MED ORDER — LACTATED RINGERS IV SOLN
INTRAVENOUS | Status: DC
  Administered 2014-04-09 (×2): via INTRAVENOUS

## 2014-04-09 MED ORDER — ACETAMINOPHEN 325 MG PO TABS
650.0000 mg | ORAL_TABLET | Freq: Four times a day (QID) | ORAL | Status: DC | PRN
  Filled 2014-04-09: qty 2

## 2014-04-09 MED ORDER — OXYCODONE HCL 5 MG PO TABS
5.0000 mg | ORAL_TABLET | ORAL | Status: DC | PRN
  Administered 2014-04-09 – 2014-04-11 (×9): 10 mg via ORAL
  Filled 2014-04-09 (×8): qty 2

## 2014-04-09 MED ORDER — FENTANYL CITRATE 0.05 MG/ML IJ SOLN
INTRAMUSCULAR | Status: DC | PRN
  Administered 2014-04-09: 25 ug via INTRAVENOUS
  Administered 2014-04-09: 50 ug via INTRAVENOUS
  Administered 2014-04-09: 25 ug via INTRAVENOUS
  Administered 2014-04-09: 100 ug via INTRAVENOUS
  Administered 2014-04-09: 50 ug via INTRAVENOUS

## 2014-04-09 MED ORDER — MIDAZOLAM HCL 5 MG/5ML IJ SOLN
INTRAMUSCULAR | Status: DC | PRN
  Administered 2014-04-09: 2 mg via INTRAVENOUS

## 2014-04-09 MED ORDER — 0.9 % SODIUM CHLORIDE (POUR BTL) OPTIME
TOPICAL | Status: DC | PRN
  Administered 2014-04-09: 1000 mL

## 2014-04-09 MED ORDER — METHOCARBAMOL 100 MG/ML IJ SOLN: 500.0000 mg | Freq: Four times a day (QID) | INTRAVENOUS | Status: DC | PRN

## 2014-04-09 MED ORDER — FENTANYL CITRATE 0.05 MG/ML IJ SOLN
INTRAMUSCULAR | Status: AC
  Administered 2014-04-09: 50 ug via INTRAVENOUS
  Filled 2014-04-09: qty 2

## 2014-04-09 MED ORDER — PROPOFOL 10 MG/ML IV BOLUS
INTRAVENOUS | Status: DC | PRN
  Administered 2014-04-09: 200 mg via INTRAVENOUS

## 2014-04-09 MED ORDER — FENTANYL CITRATE 0.05 MG/ML IJ SOLN
INTRAMUSCULAR | Status: AC
  Filled 2014-04-09: qty 5

## 2014-04-09 MED ORDER — POLYETHYLENE GLYCOL 3350 17 G PO PACK: 17.0000 g | PACK | Freq: Every day | ORAL | Status: DC | PRN

## 2014-04-09 MED ORDER — SENNA 8.6 MG PO TABS
1.0000 | ORAL_TABLET | Freq: Two times a day (BID) | ORAL | Status: DC
  Administered 2014-04-09 – 2014-04-10 (×3): 8.6 mg via ORAL
  Filled 2014-04-09 (×5): qty 1

## 2014-04-09 MED ORDER — HYDROMORPHONE HCL PF 1 MG/ML IJ SOLN
INTRAMUSCULAR | Status: AC
  Filled 2014-04-09: qty 1

## 2014-04-09 MED ORDER — ENOXAPARIN SODIUM 30 MG/0.3ML ~~LOC~~ SOLN
30.0000 mg | Freq: Two times a day (BID) | SUBCUTANEOUS | Status: DC
  Administered 2014-04-10 – 2014-04-11 (×3): 30 mg via SUBCUTANEOUS
  Filled 2014-04-09 (×5): qty 0.3

## 2014-04-09 MED ORDER — ONDANSETRON HCL 4 MG/2ML IJ SOLN: 4.0000 mg | Freq: Four times a day (QID) | INTRAMUSCULAR | Status: DC | PRN

## 2014-04-09 MED ORDER — HYDROMORPHONE HCL PF 1 MG/ML IJ SOLN
1.0000 mg | INTRAMUSCULAR | Status: DC | PRN
  Administered 2014-04-09 – 2014-04-10 (×6): 1 mg via INTRAVENOUS
  Filled 2014-04-09 (×6): qty 1

## 2014-04-09 MED ORDER — FENTANYL CITRATE 0.05 MG/ML IJ SOLN
25.0000 ug | INTRAMUSCULAR | Status: DC | PRN
  Administered 2014-04-09 (×3): 50 ug via INTRAVENOUS

## 2014-04-09 MED ORDER — METHOCARBAMOL 500 MG PO TABS
500.0000 mg | ORAL_TABLET | Freq: Four times a day (QID) | ORAL | Status: DC | PRN
  Administered 2014-04-09 – 2014-04-11 (×5): 500 mg via ORAL
  Filled 2014-04-09 (×4): qty 1

## 2014-04-09 MED ORDER — PHENYLEPHRINE HCL 10 MG/ML IJ SOLN
INTRAMUSCULAR | Status: DC | PRN
  Administered 2014-04-09 (×2): 80 ug via INTRAVENOUS

## 2014-04-09 MED ORDER — DEXAMETHASONE SODIUM PHOSPHATE 10 MG/ML IJ SOLN
10.0000 mg | Freq: Three times a day (TID) | INTRAMUSCULAR | Status: AC
  Administered 2014-04-09: 10 mg via INTRAVENOUS
  Filled 2014-04-09 (×3): qty 1

## 2014-04-09 MED ORDER — METOCLOPRAMIDE HCL 5 MG/ML IJ SOLN: 5.0000 mg | Freq: Three times a day (TID) | INTRAMUSCULAR | Status: DC | PRN

## 2014-04-09 MED ORDER — EPHEDRINE SULFATE 50 MG/ML IJ SOLN
INTRAMUSCULAR | Status: DC | PRN
  Administered 2014-04-09 (×2): 10 mg via INTRAVENOUS
  Administered 2014-04-09: 5 mg via INTRAVENOUS

## 2014-04-09 MED ORDER — ONDANSETRON HCL 4 MG/2ML IJ SOLN
INTRAMUSCULAR | Status: AC
  Filled 2014-04-09: qty 4

## 2014-04-09 MED ORDER — KETOROLAC TROMETHAMINE 15 MG/ML IJ SOLN
7.5000 mg | Freq: Four times a day (QID) | INTRAMUSCULAR | Status: AC
  Administered 2014-04-09 – 2014-04-10 (×4): 7.5 mg via INTRAVENOUS
  Filled 2014-04-09 (×6): qty 1

## 2014-04-09 MED ORDER — PROMETHAZINE HCL 25 MG PO TABS: 25.0000 mg | ORAL_TABLET | Freq: Four times a day (QID) | ORAL | Status: DC | PRN

## 2014-04-09 MED ORDER — HYDROMORPHONE HCL PF 1 MG/ML IJ SOLN: 0.5000 mg | Freq: Once | INTRAMUSCULAR | Status: DC

## 2014-04-09 MED ORDER — OXYCODONE-ACETAMINOPHEN 10-325 MG PO TABS: 1.0000 | ORAL_TABLET | Freq: Four times a day (QID) | ORAL | Status: DC | PRN

## 2014-04-09 MED ORDER — ALUM & MAG HYDROXIDE-SIMETH 200-200-20 MG/5ML PO SUSP: 30.0000 mL | ORAL | Status: DC | PRN

## 2014-04-09 MED ORDER — DIPHENHYDRAMINE HCL 12.5 MG/5ML PO ELIX
12.5000 mg | ORAL_SOLUTION | ORAL | Status: DC | PRN
Start: 2014-04-09 — End: 2014-04-11

## 2014-04-09 MED ORDER — FENTANYL CITRATE 0.05 MG/ML IJ SOLN
50.0000 ug | INTRAMUSCULAR | Status: DC | PRN
  Administered 2014-04-09: 100 ug via INTRAVENOUS

## 2014-04-09 MED ORDER — METHOCARBAMOL 500 MG PO TABS
ORAL_TABLET | ORAL | Status: AC
  Filled 2014-04-09: qty 1

## 2014-04-09 MED ORDER — ATORVASTATIN CALCIUM 40 MG PO TABS
40.0000 mg | ORAL_TABLET | Freq: Every day | ORAL | Status: DC
  Administered 2014-04-09 – 2014-04-10 (×2): 40 mg via ORAL
  Filled 2014-04-09 (×3): qty 1

## 2014-04-09 MED ORDER — HYDROMORPHONE HCL PF 1 MG/ML IJ SOLN
0.5000 mg | INTRAMUSCULAR | Status: AC | PRN
  Administered 2014-04-09 (×4): 0.5 mg via INTRAVENOUS

## 2014-04-09 MED ORDER — MIDAZOLAM HCL 2 MG/2ML IJ SOLN: 1.0000 mg | INTRAMUSCULAR | Status: DC | PRN

## 2014-04-09 MED ORDER — PHENOL 1.4 % MT LIQD: 1.0000 | OROMUCOSAL | Status: DC | PRN

## 2014-04-09 SURGICAL SUPPLY — 63 items
APL SKNCLS STERI-STRIP NONHPOA (GAUZE/BANDAGES/DRESSINGS) ×2
BANDAGE ESMARK 6X9 LF (GAUZE/BANDAGES/DRESSINGS) ×2 IMPLANT
BENZOIN TINCTURE PRP APPL 2/3 (GAUZE/BANDAGES/DRESSINGS) ×3 IMPLANT
BIT DRILL QUICK REL 1/8 2PK SL (DRILL) ×1 IMPLANT
BNDG CMPR 9X6 STRL LF SNTH (GAUZE/BANDAGES/DRESSINGS) ×2
BNDG ESMARK 6X9 LF (GAUZE/BANDAGES/DRESSINGS) ×4
BOOTCOVER CLEANROOM LRG (PROTECTIVE WEAR) ×8 IMPLANT
BOWL SMART MIX CTS (DISPOSABLE) ×5 IMPLANT
CAPT KNEE OXFORD ×3 IMPLANT
CEMENT HV SMART SET (Cement) ×5 IMPLANT
CLOSURE STERI-STRIP 1/2X4 (GAUZE/BANDAGES/DRESSINGS) ×1
CLSR STERI-STRIP ANTIMIC 1/2X4 (GAUZE/BANDAGES/DRESSINGS) ×4 IMPLANT
COVER SURGICAL LIGHT HANDLE (MISCELLANEOUS) ×4 IMPLANT
CUFF TOURNIQUET SINGLE 34IN LL (TOURNIQUET CUFF) ×3 IMPLANT
DRAPE EXTREMITY T 121X128X90 (DRAPE) ×4 IMPLANT
DRAPE ORTHO SPLIT 77X108 STRL (DRAPES) ×4
DRAPE PROXIMA HALF (DRAPES) ×4 IMPLANT
DRAPE SURG ORHT 6 SPLT 77X108 (DRAPES) ×1 IMPLANT
DRAPE U-SHAPE 47X51 STRL (DRAPES) ×4 IMPLANT
DRILL QUICK RELEASE 1/8 INCH (DRILL) ×2
DURAPREP 26ML APPLICATOR (WOUND CARE) ×4 IMPLANT
ELECT CAUTERY BLADE 6.4 (BLADE) ×4 IMPLANT
ELECT REM PT RETURN 9FT ADLT (ELECTROSURGICAL) ×4
ELECTRODE REM PT RTRN 9FT ADLT (ELECTROSURGICAL) ×2 IMPLANT
EVACUATOR 1/8 PVC DRAIN (DRAIN) IMPLANT
GLOVE BIOGEL M 7.0 STRL (GLOVE) ×7 IMPLANT
GLOVE BIOGEL PI IND STRL 7.5 (GLOVE) ×2 IMPLANT
GLOVE BIOGEL PI INDICATOR 7.5 (GLOVE) ×2
GLOVE BIOGEL PI ORTHO PRO SZ8 (GLOVE) ×4
GLOVE ORTHO TXT STRL SZ7.5 (GLOVE) ×4 IMPLANT
GLOVE PI ORTHO PRO STRL SZ8 (GLOVE) ×4 IMPLANT
GLOVE SURG ORTHO 8.0 STRL STRW (GLOVE) ×8 IMPLANT
GLOVE SURG SS PI 7.0 STRL IVOR (GLOVE) ×3 IMPLANT
GOWN STRL REUS W/ TWL LRG LVL3 (GOWN DISPOSABLE) IMPLANT
GOWN STRL REUS W/TWL LRG LVL3 (GOWN DISPOSABLE)
HANDPIECE INTERPULSE COAX TIP (DISPOSABLE) ×4
HOOD PEEL AWAY FACE SHEILD DIS (HOOD) ×9 IMPLANT
KIT BASIN OR (CUSTOM PROCEDURE TRAY) ×4 IMPLANT
KIT ROOM TURNOVER OR (KITS) ×4 IMPLANT
MANIFOLD NEPTUNE II (INSTRUMENTS) ×4 IMPLANT
NS IRRIG 1000ML POUR BTL (IV SOLUTION) ×4 IMPLANT
PACK TOTAL JOINT (CUSTOM PROCEDURE TRAY) ×4 IMPLANT
PAD ABD 8X10 STRL (GAUZE/BANDAGES/DRESSINGS) ×3 IMPLANT
PAD ARMBOARD 7.5X6 YLW CONV (MISCELLANEOUS) ×5 IMPLANT
PAD CAST 4YDX4 CTTN HI CHSV (CAST SUPPLIES) ×2 IMPLANT
PADDING CAST COTTON 4X4 STRL (CAST SUPPLIES) ×4
PADDING CAST COTTON 6X4 STRL (CAST SUPPLIES) ×4 IMPLANT
RUBBERBAND STERILE (MISCELLANEOUS) ×1 IMPLANT
SAWBLADE OXFORD PARTIAL (BLADE) ×3 IMPLANT
SET HNDPC FAN SPRY TIP SCT (DISPOSABLE) ×2 IMPLANT
SPONGE GAUZE 4X4 12PLY (GAUZE/BANDAGES/DRESSINGS) ×4 IMPLANT
SPONGE GAUZE 4X4 12PLY STER LF (GAUZE/BANDAGES/DRESSINGS) ×3 IMPLANT
STAPLER VISISTAT 35W (STAPLE) ×4 IMPLANT
SUCTION FRAZIER TIP 10 FR DISP (SUCTIONS) ×4 IMPLANT
SUT MNCRL AB 4-0 PS2 18 (SUTURE) ×4 IMPLANT
SUT VIC AB 0 CT1 27 (SUTURE) ×4
SUT VIC AB 0 CT1 27XBRD ANBCTR (SUTURE) ×3 IMPLANT
SUT VIC AB 1 CT1 27 (SUTURE) ×4
SUT VIC AB 1 CT1 27XBRD ANBCTR (SUTURE) ×3 IMPLANT
SUT VIC AB 3-0 SH 8-18 (SUTURE) ×4 IMPLANT
TOWEL OR 17X24 6PK STRL BLUE (TOWEL DISPOSABLE) ×4 IMPLANT
TOWEL OR 17X26 10 PK STRL BLUE (TOWEL DISPOSABLE) ×4 IMPLANT
WATER STERILE IRR 1000ML POUR (IV SOLUTION) ×12 IMPLANT

## 2014-04-09 NOTE — Progress Notes (Signed)
Orthopedic Tech Progress Note Patient Details:  Brandon EvenerJames E King Jun 09, 1961 [id number]  Ortho Devices Ortho Device/Splint Location: OHF  Overhead frame applied to head of bed [name] 04/09/2014, 5:58 PM

## 2014-04-09 NOTE — Anesthesia Procedure Notes (Signed)
Procedure Name: LMA Insertion Date/Time: 04/09/2014 11:05 AM Performed by: Marena ChancyBECKNER, Arriyah Madej S Pre-anesthesia Checklist: Patient identified, Timeout performed, Emergency Drugs available, Suction available and Patient being monitored Patient Re-evaluated:Patient Re-evaluated prior to inductionOxygen Delivery Method: Circle system utilized Preoxygenation: Pre-oxygenation with 100% oxygen Intubation Type: IV induction LMA: LMA inserted LMA Size: 4.0 Number of attempts: 1 Placement Confirmation: breath sounds checked- equal and bilateral and positive ETCO2 Tube secured with: Tape Dental Injury: Teeth and Oropharynx as per pre-operative assessment

## 2014-04-09 NOTE — Transfer of Care (Signed)
Immediate Anesthesia Transfer of Care Note  Patient: Brandon King  Procedure(s) Performed: Procedure(s): ARTHROSCOPY KNEE (Left) UNICOMPARTMENTAL KNEE (Left)  Patient Location: PACU  Anesthesia Type:General  Level of Consciousness: awake, alert  and oriented  Airway & Oxygen Therapy: Patient Spontanous Breathing and Patient connected to nasal cannula oxygen  Post-op Assessment: Report given to PACU RN and Post -op Vital signs reviewed and stable  Post vital signs: Reviewed and stable  Complications: No apparent anesthesia complications

## 2014-04-09 NOTE — Progress Notes (Signed)
Utilization review completed.  

## 2014-04-09 NOTE — Discharge Instructions (Signed)
Diet: As you were doing prior to hospitalization  ° °Shower:  May shower but keep the wounds dry, use an occlusive plastic wrap, NO SOAKING IN TUB.  If the bandage gets wet, change with a clean dry gauze. ° °Dressing:  You may change your dressing 3-5 days after surgery.  Then change the dressing daily with sterile gauze dressing.   ° °There are sticky tapes (steri-strips) on your wounds and all the stitches are absorbable.  Leave the steri-strips in place when changing your dressings, they will peel off with time, usually 2-3 weeks. ° °Activity:  Increase activity slowly as tolerated, but follow the weight bearing instructions below.  No lifting or driving for 6 weeks. ° °Weight Bearing:   As tolerated.   ° °To prevent constipation: you may use a stool softener such as - ° °Colace (over the counter) 100 mg by mouth twice a day  °Drink plenty of fluids (prune juice may be helpful) and high fiber foods °Miralax (over the counter) for constipation as needed.   ° °Itching:  If you experience itching with your medications, try taking only a single pain pill, or even half a pain pill at a time.  You may take up to 10 pain pills per day, and you can also use benadryl over the counter for itching or also to help with sleep.  ° °Precautions:  If you experience chest pain or shortness of breath - call 911 immediately for transfer to the hospital emergency department!! ° °If you develop a fever greater that 101 F, purulent drainage from wound, increased redness or drainage from wound, or calf pain -- Call the office at [phone]                                                °Follow- Up Appointment:  Please call for an appointment to be seen in 2 weeks [place] - [phone] ° ° ° ° ° °

## 2014-04-09 NOTE — H&P (Signed)
PREOPERATIVE H&P  Chief Complaint: DJD LEFT KNEE,LEFT MENISCUS TEAR  HPI: Brandon King is a 53 y.o. male who presents for preoperative history and physical with a diagnosis of DJD LEFT KNEE,LEFT MENISCUS TEAR. Symptoms are rated as moderate to severe, and have been worsening.  This is significantly impairing activities of daily living.  He has elected for surgical management. We have tried injections, activity modification, anti-inflammatories, and continues to have persistent medial sided knee pain.  Past Medical History  Diagnosis Date  . Hyperlipidemia   . Hypertension   . Arthritis   . Pneumonia     hx of   Past Surgical History  Procedure Laterality Date  . Appendectomy  2007  . Nasal septum surgery    . Tonsillectomy     History   Social History  . Marital Status: Married    Spouse Name: N/A    Number of Children: N/A  . Years of Education: N/A   Social History Main Topics  . Smoking status: Former Games developermoker  . Smokeless tobacco: Never Used  . Alcohol Use: Yes     Comment: 3 beers/week  . Drug Use: No  . Sexual Activity: None   Other Topics Concern  . None   Social History Narrative  . None   Family History  Problem Relation Age of Onset  . Heart disease Mother   . Heart disease Father   . Heart disease Brother   . Heart disease Paternal Grandfather   . Colon cancer Neg Hx   . Esophageal cancer Neg Hx   . Rectal cancer Neg Hx   . Stomach cancer Neg Hx    Allergies  Allergen Reactions  . Percocet [Oxycodone-Acetaminophen] Itching  . Sulfa Antibiotics Other (See Comments)    Mouth blisters   Prior to Admission medications   Medication Sig Start Date End Date Taking? Authorizing Provider  hydrochlorothiazide (HYDRODIURIL) 25 MG tablet Take 1 tablet by mouth Daily. 04/12/12  Yes Historical Provider, MD  LIPITOR 40 MG tablet Take 40 mg by mouth at bedtime.  04/17/12  Yes Historical Provider, MD  meloxicam (MOBIC) 15 MG tablet Take 1 tablet by mouth  Daily. 04/12/12  Yes Historical Provider, MD  ramipril (ALTACE) 10 MG capsule Take 1 capsule by mouth Daily. 04/12/12  Yes Historical Provider, MD     Positive ROS: All other systems have been reviewed and were otherwise negative with the exception of those mentioned in the HPI and as above.  Physical Exam: General: Alert, no acute distress Cardiovascular: No pedal edema Respiratory: No cyanosis, no use of accessory musculature GI: No organomegaly, abdomen is soft and non-tender Skin: No lesions in the area of chief complaint Neurologic: Sensation intact distally Psychiatric: Patient is competent for consent with normal mood and affect Lymphatic: No axillary or cervical lymphadenopathy  MUSCULOSKELETAL: Left knee has range of motion 0-125, mild pseudo-laxity to valgus testing, positive medial joint line tenderness, no pain laterally. Intact anterior cruciate ligament.  MRI demonstrates a for complex medial meniscus tear with full-thickness chondral loss.  Assessment: DJD LEFT KNEE,LEFT MENISCUS TEAR  Plan: Plan for Procedure(s): Left knee arthroscopy, evaluation of chondral integrity, and partial knee replacement if full-thickness loss present, otherwise knee arthroscopy with partial meniscectomy.  The risks benefits and alternatives were discussed with the patient including but not limited to the risks of nonoperative treatment, versus surgical intervention including infection, bleeding, nerve injury,  blood clots, cardiopulmonary complications, morbidity, mortality, among others, and they were willing to proceed.  Eulas PostJoshua P Ivie Savitt, MD Cell [phone]   04/09/2014 10:19 AM

## 2014-04-09 NOTE — Anesthesia Preprocedure Evaluation (Addendum)
Anesthesia Evaluation  Patient identified by MRN, date of birth, ID band Patient awake    Reviewed: Allergy & Precautions, H&P , NPO status , Patient's Chart, lab work & pertinent test results  Airway Mallampati: II      Dental   Pulmonary pneumonia -, former smoker,          Cardiovascular hypertension,     Neuro/Psych    GI/Hepatic negative GI ROS, Neg liver ROS,   Endo/Other  negative endocrine ROS  Renal/GU negative Renal ROS     Musculoskeletal   Abdominal   Peds  Hematology   Anesthesia Other Findings   Reproductive/Obstetrics                          Anesthesia Physical Anesthesia Plan  ASA: III  Anesthesia Plan: General   Post-op Pain Management:    Induction: Intravenous  Airway Management Planned: Oral ETT  Additional Equipment:   Intra-op Plan:   Post-operative Plan: Extubation in OR  Informed Consent: I have reviewed the patients History and Physical, chart, labs and discussed the procedure including the risks, benefits and alternatives for the proposed anesthesia with the patient or authorized representative who has indicated his/her understanding and acceptance.   Dental advisory given  Plan Discussed with: CRNA and Anesthesiologist  Anesthesia Plan Comments:         Anesthesia Quick Evaluation

## 2014-04-09 NOTE — Op Note (Signed)
04/09/2014  12:54 PM  PATIENT:  Brandon King    PRE-OPERATIVE DIAGNOSIS:  DJD LEFT KNEE,LEFT MENISCUS TEAR  POST-OPERATIVE DIAGNOSIS:  Same  PROCEDURE:  Diagnostic ARTHROSCOPY left KNEE, UNICOMPARTMENTAL KNEE replacement  SURGEON:  Eulas PostJoshua P Cutter Passey, MD  PHYSICIAN ASSISTANT: Janace LittenBrandon Parry, OPA-C, present and scrubbed throughout the case, critical for completion in a timely fashion, and for retraction, instrumentation, and closure.  ANESTHESIA:   General  PREOPERATIVE INDICATIONS:  Brandon King is a  53 y.o. male with a diagnosis of DJD LEFT KNEE,LEFT MENISCUS TEAR who failed conservative measures and elected for surgical management.    The risks benefits and alternatives were discussed with the patient preoperatively including but not limited to the risks of infection, bleeding, nerve injury, cardiopulmonary complications, blood clots, the need for revision surgery, among others, and the patient was willing to proceed.  OPERATIVE IMPLANTS: Biomet Oxford mobile bearing medial compartment arthroplasty femur size small, tibia size C, bearing size 4.  OPERATIVE FINDINGS: Endstage grade 4 medial compartment osteoarthritis. No significant changes in the lateral or patellofemoral joint.  The ACL was intact.  OPERATIVE PROCEDURE: The patient was brought to the operating room placed in supine position. General anesthesia was administered. IV antibiotics were given. The lower extremity was placed in the legholder and prepped and draped in usual sterile fashion.  Time out was performed.  The leg was elevated and exsanguinated and the tourniquet was inflated. Initially, I performed a diagnostic arthroscopy, in order to confirm that in fact there was grade 4 changes on the tibia and femur requiring arthroplasty. This was in fact the case. I also inspected the remainder of the joint which was intact  Anteromedial incision was performed, and I took care to preserve the MCL. Parapatellar incision  was carried out, and the osteophytes were excised, along with the medial meniscus and a small portion of the fat pad.  The extra medullary tibial cutting jig was applied, using the spoon and the 4mm G-Clamp with the 2 mm shim, and I took care to protect the anterior cruciate ligament insertion and the tibial spine. The medial collateral ligament was also protected, and I resected my proximal tibia, matching the anatomic slope.   The proximal tibial bony cut was removed in one piece, and I turned my attention to the femur.  The intramedullary femoral rod was placed using the drill, and then using the appropriate reference, I assembled the femoral jig, setting my posterior cutting block. I resected my posterior femur, used the 0 spigot for the anterior femur, and then measured my gap.   I then used the appropriate mill to match the extension gap to the flexion gap. Initially, the flexion gap with 4, and the extension gap was 0, and I used the 4 mm spigot. The gaps were then measured again with the appropriate feeler gauges. Once I had balanced flexion and extension gaps, I then completed the preparation of the femur.  I milled off the anterior aspect of the distal femur to prevent impingement. I also exposed the tibia, and selected the above-named component, and then used the cutting jig to prepare the keel slot on the tibia. I also used the awl to curette out the bone to complete the preparation of the keel. The back wall was intact.  I then placed trial components, and it was found to have excellent motion, and appropriate balance.  I then cemented the components into place, cementing the tibia first, removing all excess cement, and then  cementing the femur.  All loose cement was removed.  The real polyethylene insert was applied manually, and the knee was taken through functional range of motion, and found to have excellent stability and restoration of joint motion, with excellent balance.  The  wounds were irrigated copiously, and the parapatellar tissue closed with Vicryl, followed by Vicryl for the subcutaneous tissue, with routine closure with Steri-Strips and sterile gauze.  The tourniquet was released, and the patient was awakened and extubated and returned to PACU in stable and satisfactory condition. There were no complications.

## 2014-04-10 ENCOUNTER — Encounter (HOSPITAL_COMMUNITY): Payer: Self-pay | Admitting: Orthopedic Surgery

## 2014-04-10 LAB — BASIC METABOLIC PANEL
BUN: 13 mg/dL (ref 6–23)
CALCIUM: 9 mg/dL (ref 8.4–10.5)
CO2: 25 mEq/L (ref 19–32)
Chloride: 97 mEq/L (ref 96–112)
Creatinine, Ser: 0.88 mg/dL (ref 0.50–1.35)
GFR calc Af Amer: >90 mL/min (ref >90)
GLUCOSE: 173 mg/dL — AB (ref 70–99)
Potassium: 4.2 mEq/L (ref 3.7–5.3)
Sodium: 137 mEq/L (ref 137–147)

## 2014-04-10 LAB — CBC
HEMATOCRIT: 38.9 % — AB (ref 39.0–52.0)
HEMOGLOBIN: 13.3 g/dL (ref 13.0–17.0)
MCH: 31.3 pg (ref 26.0–34.0)
MCHC: 34.2 g/dL (ref 30.0–36.0)
MCV: 91.5 fL (ref 78.0–100.0)
Platelets: 208 10*3/uL (ref 150–400)
RBC: 4.25 MIL/uL (ref 4.22–5.81)
RDW: 12.7 % (ref 11.5–15.5)
WBC: 10.1 10*3/uL (ref 4.0–10.5)

## 2014-04-10 NOTE — Anesthesia Postprocedure Evaluation (Signed)
  Anesthesia Post-op Note  Patient: Brandon EvenerJames E King  Procedure(s) Performed: Procedure(s): ARTHROSCOPY KNEE (Left) UNICOMPARTMENTAL KNEE (Left)  Patient Location: PACU  Anesthesia Type:GA combined with regional for post-op pain  Level of Consciousness: awake, alert , oriented and patient cooperative  Airway and Oxygen Therapy: Patient Spontanous Breathing  Post-op Pain: mild  Post-op Assessment: Post-op Vital signs reviewed, Patient's Cardiovascular Status Stable, Respiratory Function Stable, Patent Airway, No signs of Nausea or vomiting and Pain level controlled  Post-op Vital Signs: stable  Last Vitals:  Filed Vitals:   04/10/14 0626  BP: 145/82  Pulse: 82  Temp: 37.1 C  Resp: 18    Complications: No apparent anesthesia complications

## 2014-04-10 NOTE — Discharge Summary (Addendum)
Physician Discharge Summary  Patient ID: Brandon King MRN: [id number] DOB/AGE: [birth year] 55 y.o.  Admit date: 04/09/2014 Discharge date: 04/11/2014  Admission Diagnoses:  Osteoarthritis of left knee  Discharge Diagnoses:  Principal Problem:   Osteoarthritis of left knee Active Problems:   Knee osteoarthritis   Past Medical History  Diagnosis Date  . Hyperlipidemia   . Hypertension   . Pneumonia     hx of  . Kidney stones     "never had OR" (04/09/2014)  . Arthritis   . Osteoarthritis of left knee 04/09/2014    Surgeries: Procedure(s): ARTHROSCOPY KNEE UNICOMPARTMENTAL KNEE on 04/09/2014   Consultants (if any):    Discharged Condition: Improved  Hospital Course: Brandon King is an 53 y.o. male who was admitted 04/09/2014 with a diagnosis of Osteoarthritis of left knee and went to the operating room on 04/09/2014 and underwent the above named procedures.    He was given perioperative antibiotics:      Anti-infectives   Start     Dose/Rate Route Frequency Ordered Stop   04/09/14 1700  ceFAZolin (ANCEF) IVPB 2 g/50 mL premix     2 g 100 mL/hr over 30 Minutes Intravenous Every 6 hours 04/09/14 1534 04/10/14 0008   04/09/14 0600  ceFAZolin (ANCEF) IVPB 2 g/50 mL premix     2 g 100 mL/hr over 30 Minutes Intravenous On call to O.R. 04/08/14 1341 04/09/14 1116    .  He was given sequential compression devices, early ambulation, and lovenox for DVT prophylaxis.  He benefited maximally from the hospital stay and there were no complications.    Recent vital signs:  Filed Vitals:   04/11/14 0630  BP: 116/70  Pulse: 90  Temp: 98.4 F (36.9 C)  Resp: 18    Recent laboratory studies:  Lab Results  Component Value Date   HGB 13.3 04/10/2014   HGB 13.7 04/09/2014   HGB 14.8 04/08/2014   Lab Results  Component Value Date   WBC 10.1 04/10/2014   PLT 208 04/10/2014   Lab Results  Component Value Date   INR 0.92 04/08/2014   Lab Results  Component Value Date    NA 137 04/10/2014   K 4.2 04/10/2014   CL 97 04/10/2014   CO2 25 04/10/2014   BUN 13 04/10/2014   CREATININE 0.88 04/10/2014   GLUCOSE 173* 04/10/2014    Discharge Medications:     Medication List    STOP taking these medications       meloxicam 15 MG tablet  Commonly known as:  MOBIC      TAKE these medications       enoxaparin 30 MG/0.3ML injection  Commonly known as:  LOVENOX  Inject 0.3 mLs (30 mg total) into the skin every 12 (twelve) hours.     hydrochlorothiazide 25 MG tablet  Commonly known as:  HYDRODIURIL  Take 1 tablet by mouth Daily.     HYDROmorphone 2 MG tablet  Commonly known as:  DILAUDID  Take 1 tablet (2 mg total) by mouth every 4 (four) hours as needed for severe pain.     LIPITOR 40 MG tablet  Generic drug:  atorvastatin  Take 40 mg by mouth at bedtime.     methocarbamol 500 MG tablet  Commonly known as:  ROBAXIN  Take 1 tablet (500 mg total) by mouth 4 (four) times daily.     oxyCODONE-acetaminophen 10-325 MG per tablet  Commonly known as:  PERCOCET  Take 1-2 tablets by mouth  every 6 (six) hours as needed for pain. MAXIMUM TOTAL ACETAMINOPHEN DOSE IS 4000 MG PER DAY     promethazine 25 MG tablet  Commonly known as:  PHENERGAN  Take 1 tablet (25 mg total) by mouth every 6 (six) hours as needed for nausea or vomiting.     ramipril 10 MG capsule  Commonly known as:  ALTACE  Take 1 capsule by mouth Daily.     sennosides-docusate sodium 8.6-50 MG tablet  Commonly known as:  SENOKOT-S  Take 2 tablets by mouth daily.        Diagnostic Studies: Dg Chest 2 View  04/08/2014   CLINICAL DATA:  Meniscal tear.  Pre operative respiratory exam.  EXAM: CHEST  2 VIEW  COMPARISON:  None.  FINDINGS: The heart size and mediastinal contours are within normal limits. Both lungs are clear. The visualized skeletal structures are unremarkable.  IMPRESSION: Normal exam.   Electronically Signed   By: Geanie CooleyJim  Maxwell M.D.   On: 04/08/2014 17:02   Dg Knee Left  Port  04/09/2014   CLINICAL DATA:  Post arthroplasty.  EXAM: PORTABLE LEFT KNEE - 1-2 VIEW  COMPARISON:  None.  FINDINGS: Changes of left knee hemiarthroplasty involving the medial compartment. No hardware or bony complicating feature. Soft tissue and joint space gas noted.  IMPRESSION: Left knee medial compartment arthroplasty.  No complicating feature.   Electronically Signed   By: Charlett NoseKevin  Dover M.D.   On: 04/09/2014 14:09    Disposition:     Follow-up Information   Follow up with Eulas PostLANDAU,Tyrian Peart P, MD. Schedule an appointment as soon as possible for a visit in 2 weeks.   Specialty:  Orthopedic Surgery   Contact information:   [address] [place] [address] [phone]        Signed: Eulas PostJoshua P Ahmadou Bolz 04/11/2014, 8:21 AM

## 2014-04-10 NOTE — Progress Notes (Signed)
Physical Therapy Treatment Patient Details Name: Brandon EvenerJames E Longley MRN: [id number] DOB: Aug 13, 1961 Today's Date: 04/10/2014    History of Present Illness pt presents with L TKA.      PT Comments    Pt moving well and anticipate will be ready for D/C tomorrow from PT stand point.    Follow Up Recommendations  Home health PT;Supervision - Intermittent     Equipment Recommendations  Rolling walker with 5" wheels    Recommendations for Other Services       Precautions / Restrictions Precautions Precautions: Knee;Fall Precaution Booklet Issued: Yes (comment) Required Braces or Orthoses: Knee Immobilizer - Left Knee Immobilizer - Left: On when out of bed or walking Restrictions Weight Bearing Restrictions: Yes LLE Weight Bearing: Weight bearing as tolerated    Mobility  Bed Mobility Overal bed mobility: Needs Assistance Bed Mobility: Supine to Sit;Sit to Supine     Supine to sit: Supervision;HOB elevated Sit to supine: Supervision;HOB elevated   General bed mobility comments: pt needs KI on to A with bringing L LE in and out of bed.    Transfers Overall transfer level: Needs assistance Equipment used: Rolling walker (2 wheeled) Transfers: Sit to/from Stand Sit to Stand: Supervision         General transfer comment: Demos good use of UEs.    Ambulation/Gait Ambulation/Gait assistance: Supervision Ambulation Distance (Feet): 300 Feet Assistive device: Rolling walker (2 wheeled) Gait Pattern/deviations: Step-to pattern;Decreased step length - right;Decreased stance time - left     General Gait Details: pt able to increase WBing on L LE and working on heel-toe pattern   Stairs Stairs: Yes Stairs assistance: Min assist Stair Management: No rails;Step to pattern;Backwards;With walker Number of Stairs: 4 General stair comments: pt demos good technique with stairs with RW.  Minimal cueing needed.    Wheelchair Mobility    Modified Rankin (Stroke Patients  Only)       Balance                                    Cognition Arousal/Alertness: Awake/alert Behavior During Therapy: WFL for tasks assessed/performed Overall Cognitive Status: Within Functional Limits for tasks assessed                      Exercises      General Comments        Pertinent Vitals/Pain Indicates pain 5/10 during activity.      Home Living                      Prior Function            PT Goals (current goals can now be found in the care plan section) Acute Rehab PT Goals Patient Stated Goal: Home Time For Goal Achievement: 04/17/14 Potential to Achieve Goals: Good Progress towards PT goals: Progressing toward goals    Frequency  7X/week    PT Plan Current plan remains appropriate    Co-evaluation             End of Session Equipment Utilized During Treatment: Gait belt;Left knee immobilizer Activity Tolerance: Patient tolerated treatment well Patient left: in bed;with call bell/phone within reach     Time: 1410-1435 PT Time Calculation (min): 25 min  Charges:  $Gait Training: 23-37 mins  G CodesSunny Schlein:      Cristie Mckinney F Raahil Ong, [place] CarolinaPT [phone] 04/10/2014, 2:45 PM

## 2014-04-10 NOTE — Plan of Care (Signed)
Problem: Consults Goal: Diagnosis- Total Joint Replacement Partial/Uniknee     

## 2014-04-10 NOTE — Evaluation (Signed)
Physical Therapy Evaluation Patient Details Name: Brandon EvenerJames E King MRN: [id number] DOB: 03/05/1961 Today's Date: 04/10/2014   History of Present Illness  pt presents with L TKA.    Clinical Impression  Pt very motivated and anticipate great progress.  Pt's wife to check if RW at home is petite size or if it will fit pt.  Will continue to follow.      Follow Up Recommendations Home health PT;Supervision - Intermittent    Equipment Recommendations  Rolling walker with 5" wheels (Unsure if RW at home is petite size, wife to check.  )    Recommendations for Other Services       Precautions / Restrictions Precautions Precautions: Knee;Fall Precaution Booklet Issued: Yes (comment) Required Braces or Orthoses: Knee Immobilizer - Left Knee Immobilizer - Left: On when out of bed or walking Restrictions Weight Bearing Restrictions: Yes LLE Weight Bearing: Weight bearing as tolerated      Mobility  Bed Mobility Overal bed mobility: Needs Assistance Bed Mobility: Supine to Sit     Supine to sit: Supervision;HOB elevated     General bed mobility comments: cues for sequencing.  pt utilized bedrail for A.    Transfers Overall transfer level: Needs assistance Equipment used: Rolling walker (2 wheeled) Transfers: Sit to/from Stand Sit to Stand: Min guard         General transfer comment: Demos good use of UEs.    Ambulation/Gait Ambulation/Gait assistance: Min guard Ambulation Distance (Feet): 140 Feet Assistive device: Rolling walker (2 wheeled) Gait Pattern/deviations: Step-to pattern;Decreased step length - right;Decreased stance time - left     General Gait Details: pt moves slowly.  cues for heel-toe gait pattern and increased WBing through L LE.    Stairs            Wheelchair Mobility    Modified Rankin (Stroke Patients Only)       Balance                                             Pertinent Vitals/Pain 7/10 with activity.   PRemedicated.      Home Living Family/patient expects to be discharged to:: Private residence Living Arrangements: Spouse/significant other Available Help at Discharge: Family;Available 24 hours/day Type of Home: House Home Access: Stairs to enter Entrance Stairs-Rails: None Entrance Stairs-Number of Steps: 2 Home Layout: One level Home Equipment: Bedside commode (Wife to see if RW from family will be tall enough for pt.  )      Prior Function Level of Independence: Independent               Hand Dominance        Extremity/Trunk Assessment   Upper Extremity Assessment: Defer to OT evaluation           Lower Extremity Assessment: LLE deficits/detail   LLE Deficits / Details: AAROM ~15 - 80.  pt still with numbness from nerve block.       Communication   Communication: No difficulties  Cognition Arousal/Alertness: Awake/alert Behavior During Therapy: WFL for tasks assessed/performed Overall Cognitive Status: Within Functional Limits for tasks assessed                      General Comments      Exercises Total Joint Exercises Ankle Circles/Pumps: AROM;Both;10 reps Quad Sets: AROM;Left;10 reps Straight Leg Raises: AAROM;Left;5 reps Long  [name]: AAROM;Left;10 reps Knee Flexion: AAROM;Left;10 reps      Assessment/Plan    PT Assessment Patient needs continued PT services  PT Diagnosis Abnormality of gait;Acute pain   PT Problem List Decreased strength;Decreased range of motion;Decreased activity tolerance;Decreased balance;Decreased mobility;Decreased knowledge of use of DME;Pain;Impaired sensation  PT Treatment Interventions DME instruction;Gait training;Stair training;Functional mobility training;Therapeutic activities;Therapeutic exercise;Balance training;Patient/family education   PT Goals (Current goals can be found in the Care Plan section) Acute Rehab PT Goals Patient Stated Goal: Home PT Goal Formulation: With patient Time For Goal  Achievement: 04/17/14 Potential to Achieve Goals: Good    Frequency 7X/week   Barriers to discharge        Co-evaluation               End of Session Equipment Utilized During Treatment: Gait belt;Left knee immobilizer Activity Tolerance: Patient tolerated treatment well Patient left: in chair;with call bell/phone within reach Nurse Communication: Mobility status         Time: 0802-0842 PT Time Calculation (min): 40 min   Charges:   PT Evaluation $Initial PT Evaluation Tier I: 1 Procedure PT Treatments $Gait Training: 8-22 mins $Therapeutic Exercise: 8-22 mins   [name]:          [name], [place] [phone] 04/10/2014, 9:32 AM

## 2014-04-10 NOTE — Progress Notes (Signed)
Patient ID: Brandon King, male   DOB: Feb 09, 1961, 53 y.o.   MRN: [id number]     Subjective:  Patient reports pain as mild to moderate.  patient sitting up in bed and eating breakfast.  He denies any CP or SOB  Objective:   VITALS:   Filed Vitals:   04/09/14 2000 04/09/14 2126 04/10/14 0000 04/10/14 0626  BP:  141/88  145/82  Pulse:  86  82  Temp:  98.4 F (36.9 C)  98.7 F (37.1 C)  TempSrc:      Resp: 16 18 16 18   Weight:      SpO2: 100% 93% 99% 99%    ABD soft Sensation intact distally Dorsiflexion/Plantar flexion intact Incision: dressing C/D/I and no drainage Good ankle motion EHF FHL firing   Lab Results  Component Value Date   WBC 10.1 04/10/2014   HGB 13.3 04/10/2014   HCT 38.9* 04/10/2014   MCV 91.5 04/10/2014   PLT 208 04/10/2014     Assessment/Plan: 1 Day Post-Op   Principal Problem:   Osteoarthritis of left knee Active Problems:   Knee osteoarthritis   Advance diet Up with therapy Plan for discharge tomorrow but could go home today if pain is better controlled and passes PT WBAT Plan dressing change tomorrow    [name] 04/10/2014, 7:49 AM  Seen and agree with above.  Teryl LucyJoshua Nahshon Reich, MD Cell [phone]

## 2014-04-10 NOTE — Progress Notes (Signed)
OT Cancellation Note  Patient Details Name: Brandon King MRN: [id number] DOB: 11/05/1961   Cancelled Treatment:    Reason Eval/Treat Not Completed: Fatigue/lethargy limiting ability to participate. Pt recently finished PT and has not had much rest. Pt requested OT to follow up in the morning when his wife is present for education and training. OT will follow-up with pt at a later time to assess ADL performance.  [name] [phone] 04/10/2014, 3:14 PM

## 2014-04-11 MED ORDER — CHLORPROMAZINE HCL 25 MG PO TABS
25.0000 mg | ORAL_TABLET | Freq: Three times a day (TID) | ORAL | Status: DC | PRN
  Administered 2014-04-11: 25 mg via ORAL
  Filled 2014-04-11 (×2): qty 1

## 2014-04-11 MED ORDER — CHLORPROMAZINE HCL 25 MG PO TABS: 25.0000 mg | ORAL_TABLET | Freq: Three times a day (TID) | ORAL | Status: DC | PRN

## 2014-04-11 NOTE — Care Management Note (Signed)
CARE MANAGEMENT NOTE 04/11/2014  Patient:  Brandon King,Brandon King   Account Number:  [id number]  Date Initiated:  04/09/2014  Documentation initiated by:  Vance PeperBRADY,Jet Traynham  Subjective/Objective Assessment:   53 yr old male s/p left knee arthoscopy, unicompartmental knee.     Action/Plan:   Patient preoperatively setup with [facility]. No changes.  PT/OT eval   Anticipated DC Date:  04/11/2014   Anticipated DC Plan:  HOME W HOME HEALTH SERVICES      DC Planning Services  CM consult      [facility]  DURABLE MED[facility] EQUIPMENT   Choice offered to / List presented to:  C-1 Patient   DME arranged  CPM        HH arranged  HH-2 PT  HH-1 RN      [facility]   Status of service:  Completed, signed off

## 2014-04-11 NOTE — Progress Notes (Signed)
Seen and agreed 04/11/2014 Eiko Mcgowen Elizabeth Deborah Dondero PTA [phone] pager [phone] office    

## 2014-04-11 NOTE — Progress Notes (Signed)
Occupational Therapy Evaluation and Discharge Patient Details Name: Brandon King MRN: [id number] DOB: 08/12/1961 Today's Date: 04/11/2014    History of Present Illness Pt is 53 yo Male s/p L TKA    Clinical Impression   PTA pt lived at home with wife and was independent with ADLs and functional mobility. Education and training completed for compensatory techniques for LB ADLs and safe toilet/tub transfers. Pt reports wife will be home 24/7 to assist with ADLs. No further acute OT needs.     Follow Up Recommendations  No OT follow up;Supervision/Assistance - 24 hour    Equipment Recommendations  None recommended by OT       Precautions / Restrictions Precautions Precautions: Knee;Fall Required Braces or Orthoses: Knee Immobilizer - Left Knee Immobilizer - Left: On when out of bed or walking Restrictions Weight Bearing Restrictions: Yes LLE Weight Bearing: Weight bearing as tolerated      Mobility Bed Mobility Overal bed mobility: Needs Assistance Bed Mobility: Supine to Sit;Sit to Supine     Supine to sit: Modified independent (Device/Increase time) (bed flat to simulate home environment) Sit to supine: Modified independent (Device/Increase time)   General bed mobility comments: VC's for safety and control OOB  Transfers Overall transfer level: Modified independent Equipment used: Rolling walker (2 wheeled) Transfers: Sit to/from Stand Sit to Stand: Supervision         General transfer comment: Patient able to perform with good technique.         ADL Overall ADL's : Modified independent                                       General ADL Comments: Pt at mod I level for ADLs; performed dressing at EOB.      Vision  Per pt report, no change from baseline.                          Pertinent Vitals/Pain No c/o pain     Hand Dominance Right   Extremity/Trunk Assessment Upper Extremity Assessment Upper Extremity Assessment:  Overall WFL for tasks assessed   Lower Extremity Assessment Lower Extremity Assessment: Defer to PT evaluation   Cervical / Trunk Assessment Cervical / Trunk Assessment: Normal   Communication Communication Communication: No difficulties   Cognition Arousal/Alertness: Awake/alert Behavior During Therapy: WFL for tasks assessed/performed Overall Cognitive Status: Within Functional Limits for tasks assessed                                Home Living Family/patient expects to be discharged to:: Private residence Living Arrangements: Spouse/significant other Available Help at Discharge: Family;Available 24 hours/day Type of Home: House Home Access: Stairs to enter Enter[employer] CorporationEntrance Stairs-Number of Steps: 2 Entrance Stairs-Rails: None Home Layout: One level     Bathroom Shower/Tub: Producer, television/film/videoWalk-in shower   Bathroom Toilet: Standard     Home Equipment: Bedside commode;Walker - 2 wheels          Prior Functioning/Environment Level of Independence: Independent              End of Session Equipment Utilized During Treatment: Rolling walker;Left knee immobilizer Nurse Communication: Other (comment) (Pt ready for d/c from therapy standpoint)  Activity Tolerance: Patient tolerated treatment well Patient left: in bed;with call bell/phone within reach;with family/visitor present   Time: 4098-11911019-1035  OT Time Calculation (min): 16 min Charges:  OT General Charges $OT Visit: 1 Procedure OT Evaluation $Initial OT Evaluation Tier I: 1 Procedure OT Treatments $Self Care/Home Management : 8-22 mins  Brandon King [phone] 04/11/2014, 10:39 AM

## 2014-04-11 NOTE — Progress Notes (Signed)
Physical Therapy Treatment Patient Details Name: Brandon King MRN: [id number] DOB: 05-04-1961 Today's Date: 04/11/2014    History of Present Illness pt presents with L TKA.      PT Comments    Patient able to tolerate therapy today. Patient is eager for d/c home today. Nurse present in room prior to therapy to discuss d/c destination and summary. Patient able to progress with functional independence with gait training. Patient able to recall stair training from last PT session with precautions and proper technique. Instructed patient's wife on proper placement of knee immobilizer. Patient's wife able to properly demonstrate donning and doffing of knee immobilizer. Patient would benefit from HHPT to encourage increased ROM and strengthening.    Follow Up Recommendations  Home health PT;Supervision - Intermittent     Equipment Recommendations  Rolling walker with 5" wheels    Recommendations for Other Services       Precautions / Restrictions Precautions Precautions: Knee;Fall Required Braces or Orthoses: Knee Immobilizer - Left Knee Immobilizer - Left: On when out of bed or walking Restrictions LLE Weight Bearing: Weight bearing as tolerated    Mobility  Bed Mobility Overal bed mobility: Needs Assistance Bed Mobility: Supine to Sit;Sit to Supine     Supine to sit: Supervision;HOB elevated Sit to supine: Supervision;HOB elevated   General bed mobility comments: patient required cues for impulsivness and control OOB.  Transfers Overall transfer level: Modified independent Equipment used: Rolling walker (2 wheeled) Transfers: Sit to/from Stand Sit to Stand: Supervision         General transfer comment: Patient able to perform with good technique.  Ambulation/Gait Ambulation/Gait assistance: Min guard Ambulation Distance (Feet): 200 Feet Assistive device: Rolling walker (2 wheeled) Gait Pattern/deviations: Step-to pattern;Decreased stride length     General  Gait Details: pt continues to increase WBing on L LE.   Stairs            Wheelchair Mobility    Modified Rankin (Stroke Patients Only)       Balance                                    Cognition Arousal/Alertness: Awake/alert Behavior During Therapy: WFL for tasks assessed/performed Overall Cognitive Status: Within Functional Limits for tasks assessed                      Exercises      General Comments        Pertinent Vitals/Pain Patient reports 4/10 pain level in posterior thigh. Patient repositioned for comfort.    Home Living                      Prior Function            PT Goals (current goals can now be found in the care plan section) Progress towards PT goals: Progressing toward goals    Frequency  7X/week    PT Plan Current plan remains appropriate    Co-evaluation             End of Session Equipment Utilized During Treatment: Gait belt;Left knee immobilizer Activity Tolerance: Patient tolerated treatment well Patient left: in bed;with call bell/phone within reach;with family/visitor present     Time: 0935-1003 PT Time Calculation (min): 28 min  Charges:  G Codes:      Corrion Stirewalt L Emoni Yang, SPTA 04/11/2014, 10:06 AM

## 2014-04-11 NOTE — Care Management Note (Signed)
CARE MANAGEMENT NOTE 04/11/2014  Patient:  Brandon King,Brandon King   Account Number:  [id number]  Date Initiated:  04/11/2014  Documentation initiated by:  Lacorey Brusca  Subjective/Objective Assessment:   53 yr old male s/p right total knee replacement.     Action/Plan:   Case manager spoke with patient concerning home health and DME needs.Patient has used [facility] with previous surgery, wants to now, Wants [facility]. This info passed on. Pt has DME.   Anticipated DC Date:  04/11/2014   Anticipated DC Plan:  HOME W HOME HEALTH SERVICES      DC Planning Services  CM consult      PAC Choice  HOME HEALTH  DURABLE MEDICAL EQUIPMENT   Choice offered to / List presented to:  C-1 Patient   DME arranged  CPM      DME agency  [employer]     HH arranged  HH-2 PT      HH agency  [facility]   Status of service:  Completed, signed off Medicare Important Message given?   (If response is "NO", the following Medicare IM given date fields will be blank) Date Medicare IM given:   Date Additional Medicare IM given:    Discharge Disposition:  HOME W HOME HEALTH SERVICES  Per UR Regulation:    

## 2014-10-28 ENCOUNTER — Ambulatory Visit (INDEPENDENT_AMBULATORY_CARE_PROVIDER_SITE_OTHER): Payer: BC Managed Care – PPO | Admitting: Podiatry

## 2014-10-28 ENCOUNTER — Encounter: Payer: Self-pay | Admitting: Podiatry

## 2014-10-28 ENCOUNTER — Ambulatory Visit (INDEPENDENT_AMBULATORY_CARE_PROVIDER_SITE_OTHER): Payer: BC Managed Care – PPO

## 2014-10-28 VITALS — BP 132/81 | HR 66 | Resp 16

## 2014-10-28 DIAGNOSIS — G5762 Lesion of plantar nerve, left lower limb: Secondary | ICD-10-CM

## 2014-10-28 DIAGNOSIS — G5782 Other specified mononeuropathies of left lower limb: Secondary | ICD-10-CM

## 2014-10-28 DIAGNOSIS — M779 Enthesopathy, unspecified: Secondary | ICD-10-CM

## 2014-10-28 NOTE — Progress Notes (Signed)
   Subjective:    Patient ID: Brandon EvenerJames E King, male    DOB: 04-26-1961, 53 y.o.   MRN: [id number]  HPI Comments: "They say its a morton's neuroma"  Patient c/o burning 4th toe left foot for about 3 years. [place]aid it was a neuroma. His PCP referred there initially. He has had injections in 2013. Helped some. He did see Dr. Hal Neerillis again 2 weeks ago and gave cortisone. Shoes are uncomfortable. No help and recommended surgery. Wanted a 2nd opinion.   Foot Pain Associated symptoms include arthralgias.      Review of Systems  HENT: Positive for sinus pressure.   Musculoskeletal: Positive for arthralgias.  All other systems reviewed and are negative.      Objective:   Physical Exam        Assessment & Plan:

## 2014-10-29 NOTE — Progress Notes (Signed)
Subjective:     Patient ID: Brandon EvenerJames E Feenstra, male   DOB: 03-28-1961, 53 y.o.   MRN: [id number]  HPIpatient presents with shooting pain around the fourth toe and moderate third toe left foot with radiating discomfort in the adjacent interspace and states that he's had previous cortisone injections which is not been successful and the pain can really drives him crazy. Does have a history of knee replacement earlier this year   Review of Systems  All other systems reviewed and are negative.      Objective:   Physical Exam  Constitutional: He is oriented to person, place, and time.  Cardiovascular: Intact distal pulses.   Musculoskeletal: Normal range of motion.  Neurological: He is oriented to person, place, and time.  Skin: Skin is warm.  Nursing note and vitals reviewed. neurovascular status found to be intact with muscle strength adequate range of motion of the subtalar and midtarsal joint within normal limits. Patient is noted to have exquisite discomfort third interspace left with shooting pains when it is palpated and the foot is squeezed. 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 Patient wants to have something done with this and I have recommended neurectomy and he has already gone over this with another physician but wants me to perform the surgery. At this time since he is well aware of this and wants to do this today I did go over the consent for him today discussing surgery explaining all possible complications and the fact that this is a clinical diagnosis and there is no guarantee that this will cure his condition. He also understands she will get some numbness between his toes in the total recovery. We'll take 6 months to one year. Patient signed consent form  after review

## 2014-11-26 DIAGNOSIS — G5760 Lesion of plantar nerve, unspecified lower limb: Secondary | ICD-10-CM

## 2014-11-27 ENCOUNTER — Telehealth: Payer: Self-pay

## 2014-11-27 NOTE — Telephone Encounter (Signed)
  Left message for patient to call with questions or concerns regarding post operative status 

## 2014-11-28 ENCOUNTER — Encounter (HOSPITAL_BASED_OUTPATIENT_CLINIC_OR_DEPARTMENT_OTHER): Payer: Self-pay | Admitting: *Deleted

## 2014-11-28 ENCOUNTER — Emergency Department (HOSPITAL_BASED_OUTPATIENT_CLINIC_OR_DEPARTMENT_OTHER)
Admission: EM | Admit: 2014-11-28 | Discharge: 2014-11-28 | Disposition: A | Discharge status: Home / Self Care | Payer: BC Managed Care – PPO | Attending: Emergency Medicine | Admitting: Emergency Medicine

## 2014-11-28 ENCOUNTER — Emergency Department (HOSPITAL_BASED_OUTPATIENT_CLINIC_OR_DEPARTMENT_OTHER): Payer: BC Managed Care – PPO

## 2014-11-28 DIAGNOSIS — R05 Cough: Secondary | ICD-10-CM

## 2014-11-28 DIAGNOSIS — Z87442 Personal history of urinary calculi: Secondary | ICD-10-CM | POA: Diagnosis not present

## 2014-11-28 DIAGNOSIS — Z9889 Other specified postprocedural states: Secondary | ICD-10-CM | POA: Insufficient documentation

## 2014-11-28 DIAGNOSIS — R21 Rash and other nonspecific skin eruption: Secondary | ICD-10-CM | POA: Insufficient documentation

## 2014-11-28 DIAGNOSIS — M1712 Unilateral primary osteoarthritis, left knee: Secondary | ICD-10-CM | POA: Diagnosis not present

## 2014-11-28 DIAGNOSIS — E860 Dehydration: Secondary | ICD-10-CM | POA: Diagnosis not present

## 2014-11-28 DIAGNOSIS — Z87891 Personal history of nicotine dependence: Secondary | ICD-10-CM | POA: Insufficient documentation

## 2014-11-28 DIAGNOSIS — Z791 Long term (current) use of non-steroidal anti-inflammatories (NSAID): Secondary | ICD-10-CM | POA: Diagnosis not present

## 2014-11-28 DIAGNOSIS — I10 Essential (primary) hypertension: Secondary | ICD-10-CM | POA: Diagnosis not present

## 2014-11-28 DIAGNOSIS — E785 Hyperlipidemia, unspecified: Secondary | ICD-10-CM | POA: Diagnosis not present

## 2014-11-28 DIAGNOSIS — J159 Unspecified bacterial pneumonia: Secondary | ICD-10-CM | POA: Diagnosis not present

## 2014-11-28 DIAGNOSIS — Z79899 Other long term (current) drug therapy: Secondary | ICD-10-CM | POA: Diagnosis not present

## 2014-11-28 DIAGNOSIS — R531 Weakness: Secondary | ICD-10-CM | POA: Diagnosis present

## 2014-11-28 DIAGNOSIS — R059 Cough, unspecified: Secondary | ICD-10-CM

## 2014-11-28 DIAGNOSIS — J189 Pneumonia, unspecified organism: Secondary | ICD-10-CM

## 2014-11-28 LAB — BASIC METABOLIC PANEL
Anion gap: 15 (ref 5–15)
BUN: 17 mg/dL (ref 6–23)
CO2: 23 mEq/L (ref 19–32)
CREATININE: 0.9 mg/dL (ref 0.50–1.35)
Calcium: 9 mg/dL (ref 8.4–10.5)
Chloride: 101 mEq/L (ref 96–112)
GFR calc Af Amer: >90 mL/min (ref >90)
GFR calc non Af Amer: >90 mL/min (ref >90)
Glucose, Bld: 155 mg/dL — ABNORMAL HIGH (ref 70–99)
Potassium: 3.5 mEq/L — ABNORMAL LOW (ref 3.7–5.3)
Sodium: 139 mEq/L (ref 137–147)

## 2014-11-28 LAB — CBC
HEMATOCRIT: 43.8 % (ref 39.0–52.0)
Hemoglobin: 15.6 g/dL (ref 13.0–17.0)
MCH: 31.9 pg (ref 26.0–34.0)
MCHC: 35.6 g/dL (ref 30.0–36.0)
MCV: 89.6 fL (ref 78.0–100.0)
PLATELETS: 271 10*3/uL (ref 150–400)
RBC: 4.89 MIL/uL (ref 4.22–5.81)
RDW: 12.3 % (ref 11.5–15.5)
WBC: 7.8 10*3/uL (ref 4.0–10.5)

## 2014-11-28 MED ORDER — LEVOFLOXACIN 500 MG PO TABS
500.0000 mg | ORAL_TABLET | Freq: Once | ORAL | Status: AC
  Administered 2014-11-28: 500 mg via ORAL
  Filled 2014-11-28: qty 1

## 2014-11-28 MED ORDER — ALBUTEROL SULFATE (2.5 MG/3ML) 0.083% IN NEBU
5.0000 mg | INHALATION_SOLUTION | Freq: Once | RESPIRATORY_TRACT | Status: AC
  Administered 2014-11-28: 5 mg via RESPIRATORY_TRACT
  Filled 2014-11-28: qty 6

## 2014-11-28 MED ORDER — SODIUM CHLORIDE 0.9 % IV BOLUS (SEPSIS)
1000.0000 mL | Freq: Once | INTRAVENOUS | Status: AC
Start: 2014-11-28 — End: 2014-11-28
  Administered 2014-11-28: 1000 mL via INTRAVENOUS

## 2014-11-28 MED ORDER — LEVOFLOXACIN 500 MG PO TABS: 500.0000 mg | ORAL_TABLET | Freq: Every day | ORAL | Status: DC

## 2014-11-28 MED ORDER — SODIUM CHLORIDE 0.9 % IV BOLUS (SEPSIS)
1000.0000 mL | Freq: Once | INTRAVENOUS | Status: AC
  Administered 2014-11-28: 1000 mL via INTRAVENOUS

## 2014-11-28 MED ORDER — ONDANSETRON HCL 4 MG/2ML IJ SOLN
INTRAMUSCULAR | Status: AC
  Filled 2014-11-28: qty 2

## 2014-11-28 MED ORDER — DIPHENHYDRAMINE HCL 25 MG PO TABS: 50.0000 mg | ORAL_TABLET | Freq: Four times a day (QID) | ORAL | Status: DC

## 2014-11-28 MED ORDER — DIPHENHYDRAMINE HCL 50 MG/ML IJ SOLN
25.0000 mg | Freq: Once | INTRAMUSCULAR | Status: AC
  Administered 2014-11-28: 25 mg via INTRAVENOUS
  Filled 2014-11-28: qty 1

## 2014-11-28 MED ORDER — ONDANSETRON HCL 4 MG/2ML IJ SOLN
4.0000 mg | Freq: Once | INTRAMUSCULAR | Status: AC
  Administered 2014-11-28: 4 mg via INTRAVENOUS

## 2014-11-28 MED ORDER — ALBUTEROL SULFATE HFA 108 (90 BASE) MCG/ACT IN AERS
2.0000 | INHALATION_SPRAY | RESPIRATORY_TRACT | Status: DC | PRN
  Administered 2014-11-28: 2 via RESPIRATORY_TRACT
  Filled 2014-11-28: qty 6.7

## 2014-11-28 NOTE — ED Notes (Signed)
Pt keeping fluids in

## 2014-11-28 NOTE — ED Notes (Signed)
Pt c/o weakness today and in last hour rash breaking out over body

## 2014-11-28 NOTE — Discharge Instructions (Signed)
Return to the ED with any concerns including difficulty breathing, vomiting and not able to keep down liquids, fainting, decreased level of alertness/lethargy, or any other alarming symptoms °

## 2014-11-28 NOTE — ED Provider Notes (Addendum)
CSN: [id number]     Arrival date & time 11/28/14  0030 History   First MD Initiated Contact with Patient 11/28/14 0056     Chief Complaint  Patient presents with  . Weakness     (Consider location/radiation/quality/duration/timing/severity/associated sxs/prior Treatment) HPI  Pt presents with c/o generalized malaise- states he feels dehydrated.  Pt states he began to have cough and congestion approx 1 week ago.  He was seen by PMD and was told he had viral infection.  Over the past week, he has continued to have productive cough, has been drinking less fluids than normal and states he feels weak and dizzy upon standing.  No chest pain, no abdominal pain.  No vomiting or diarrhea.  No specific sick contacts.  No recent travel. There are no other associated systemic symptoms, there are no other alleviating or modifying factors.   Past Medical History  Diagnosis Date  . Hyperlipidemia   . Hypertension   . Pneumonia     hx of  . Kidney stones     "never had OR" (04/09/2014)  . Arthritis   . Osteoarthritis of left knee 04/09/2014   Past Surgical History  Procedure Laterality Date  . Nasal septum surgery  1981  . Replacement unicondylar joint knee Left 04/09/2014  . Tonsillectomy and adenoidectomy  1972  . Appendectomy  2007  . Cardiac catheterization  ~ 2003  . Knee arthroscopy Left 04/09/2014    Procedure: ARTHROSCOPY KNEE;  Surgeon: Eulas PostJoshua P Landau, MD;  [facility];  Service: Orthopedics;  Laterality: Left;  . Partial knee arthroplasty Left 04/09/2014    Procedure: UNICOMPARTMENTAL KNEE;  Surgeon: Eulas PostJoshua P Landau, MD;  [facility];  Service: Orthopedics;  Laterality: Left;   Family History  Problem Relation Age of Onset  . Heart disease Mother   . Heart disease Father   . Heart disease Brother   . Heart disease Paternal Grandfather   . Colon cancer Neg Hx   . Esophageal cancer Neg Hx   . Rectal cancer Neg Hx   . Stomach cancer Neg Hx    History  Substance Use Topics   . Smoking status: Former Smoker -- 0.50 packs/day for 32 years    Types: Cigarettes  . Smokeless tobacco: Never Used     Comment: 04/09/2014 "quit smoking ~ 2008"  . Alcohol Use: 1.8 oz/week    3 Cans of beer per week    Review of Systems  ROS reviewed and all otherwise negative except for mentioned in HPI    Allergies  Percocet and Sulfa antibiotics  Home Medications   Prior to Admission medications   Medication Sig Start Date End Date Taking? Authorizing Provider  diphenhydrAMINE (BENADRYL) 25 MG tablet Take 2 tablets (50 mg total) by mouth every 6 (six) hours. Take 1-2 tablets every 6 hours x 2 days, then space out to an as needed basis 11/28/14   Ethelda ChickMartha K Linker, MD  hydrochlorothiazide (HYDRODIURIL) 25 MG tablet Take 1 tablet by mouth Daily. 04/12/12   Historical Provider, MD  levofloxacin (LEVAQUIN) 500 MG tablet Take 1 tablet (500 mg total) by mouth daily. 11/28/14   Ethelda ChickMartha K Linker, MD  LIPITOR 40 MG tablet Take 40 mg by mouth at bedtime.  04/17/12   Historical Provider, MD  meloxicam (MOBIC) 15 MG tablet Take 15 mg by mouth daily.    Historical Provider, MD  ramipril (ALTACE) 10 MG capsule Take 1 capsule by mouth Daily. 04/12/12   Historical Provider, MD  BP 134/76 mmHg  Pulse 76  Temp(Src) 98.7 F (37.1 C) (Oral)  Resp 18  Ht 5\' 10"  (1.778 m)  Wt 210 lb (95.255 kg)  BMI 30.13 kg/m2  SpO2 99%  Vitals reviewed Physical Exam  Physical Examination: General appearance - alert, well appearing, and in no distress Mental status - alert, oriented to person, place, and time Eyes - no conjunctival injection, no scleral icterus Mouth - mucous membranes moist, pharynx normal without lesions Chest - clear to auscultation, no wheezes, rales or rhonchi, symmetric air entry, frequent coughing Heart - normal rate, regular rhythm, normal S1, S2, no murmurs, rubs, clicks or gallops Abdomen - soft, nontender, nondistended, no masses or organomegaly Extremities - peripheral pulses  normal, no pedal edema, no clubbing or cyanosis Skin - normal coloration and turgor, no rashes,  ED Course  Procedures (including critical care time)  4:16 AM pt feeling much improved, he feels ready for discharge, has been drinking po fluids as well Labs Review Labs Reviewed  BASIC METABOLIC PANEL - Abnormal; Notable for the following:    Potassium 3.5 (*)    Glucose, Bld 155 (*)    All other components within normal limits  CBC    Imaging Review Dg Chest 2 View  11/28/2014   CLINICAL DATA:  Acute onset of shortness of breath, cough and congestion. Generalized weakness. Diffuse body rash. Personal history of smoking. Initial encounter.  EXAM: CHEST  2 VIEW  COMPARISON:  Chest radiograph performed 04/08/2014  FINDINGS: The lungs are hypoexpanded. Peribronchial thickening is noted. There is no evidence of pleural effusion or pneumothorax. Increased density of right infrahilar opacity likely reflects lung hypoexpansion, though an underlying mass cannot be entirely excluded.  The heart is normal in size; the mediastinal contour is within normal limits. No acute osseous abnormalities are seen.  IMPRESSION: 1. Lungs hypoexpanded.  Peribronchial thickening noted. 2. Increased density of right infrahilar opacity likely reflects lung hypoexpansion, though an underlying mass cannot be entirely excluded. CT of the chest could be considered for further evaluation if the patient's symptoms persist.   Electronically Signed   By: Roanna RaiderJeffery  Chang M.D.   On: 11/28/2014 02:28     EKG Interpretation   Date/Time:  Thursday November 28 2014 00:58:28 EST Ventricular Rate:  74 PR Interval:  138 QRS Duration: 94 QT Interval:  406 QTC Calculation: 450 R Axis:   27 Text Interpretation:  Sinus rhythm with marked sinus arrhythmia Otherwise  normal ECG No significant change since last tracing Confirmed by [facility]   MD, MARTHA [phone]) on 11/28/2014 6:15:28 AM      MDM   Final diagnoses:  Cough  Dehydration   Community acquired pneumonia    Pt presenting with generalized weakness after 1 week of cough, congestion- states he has not been drinking fluids well and feels lightheaded upon standing.  Also developed rash just prior to arrival c/w hives- unclear what new exposure he has had.  Rash resolved with benadryl on recheck.  Pt given albuterol neb, IV hydration.  Labs reassuring.  CXR shows opacity, possibly from hypoexpansion but given clinical symptoms will treat with levaquin for pneumonia.  Pt feels much improved after fluids- pt was orthostatic in terms of heart rate after first liter of fluids so given 2nd liter. .  Also given albuterol MDI.  Discharged with strict return precautions.  Pt agreeable with plan.    Ethelda ChickMartha K Linker, MD 11/28/14 [id number]  Ethelda ChickMartha K Linker, MD 11/28/14 [phone]

## 2014-11-28 NOTE — Patient Instructions (Signed)
Instructed patient on the proper use of administering albuterol mdi via aerochamber patient tolerated well 

## 2014-11-28 NOTE — ED Notes (Signed)
Pt having dry heaves  Given zofran iv

## 2014-11-28 NOTE — ED Notes (Signed)
Pt c/o generalized weakness and rash to entire body x 1 day

## 2014-11-29 ENCOUNTER — Inpatient Hospital Stay (HOSPITAL_COMMUNITY)
Admission: EM | Admit: 2014-11-29 | Discharge: 2014-11-30 | DRG: 190 | Disposition: A | Discharge status: Home / Self Care | Payer: BC Managed Care – PPO | Attending: Internal Medicine | Admitting: Internal Medicine

## 2014-11-29 ENCOUNTER — Emergency Department (HOSPITAL_COMMUNITY): Payer: BC Managed Care – PPO

## 2014-11-29 ENCOUNTER — Encounter (HOSPITAL_COMMUNITY): Payer: Self-pay | Admitting: Emergency Medicine

## 2014-11-29 DIAGNOSIS — Z96652 Presence of left artificial knee joint: Secondary | ICD-10-CM | POA: Diagnosis present

## 2014-11-29 DIAGNOSIS — E162 Hypoglycemia, unspecified: Secondary | ICD-10-CM | POA: Diagnosis present

## 2014-11-29 DIAGNOSIS — H109 Unspecified conjunctivitis: Secondary | ICD-10-CM | POA: Diagnosis present

## 2014-11-29 DIAGNOSIS — Z9049 Acquired absence of other specified parts of digestive tract: Secondary | ICD-10-CM | POA: Diagnosis present

## 2014-11-29 DIAGNOSIS — R1110 Vomiting, unspecified: Secondary | ICD-10-CM

## 2014-11-29 DIAGNOSIS — Z87891 Personal history of nicotine dependence: Secondary | ICD-10-CM

## 2014-11-29 DIAGNOSIS — J441 Chronic obstructive pulmonary disease with (acute) exacerbation: Principal | ICD-10-CM | POA: Diagnosis present

## 2014-11-29 DIAGNOSIS — J302 Other seasonal allergic rhinitis: Secondary | ICD-10-CM

## 2014-11-29 DIAGNOSIS — Z87442 Personal history of urinary calculi: Secondary | ICD-10-CM | POA: Diagnosis not present

## 2014-11-29 DIAGNOSIS — E785 Hyperlipidemia, unspecified: Secondary | ICD-10-CM | POA: Diagnosis present

## 2014-11-29 DIAGNOSIS — J309 Allergic rhinitis, unspecified: Secondary | ICD-10-CM | POA: Diagnosis present

## 2014-11-29 DIAGNOSIS — E7800 Pure hypercholesterolemia, unspecified: Secondary | ICD-10-CM | POA: Diagnosis present

## 2014-11-29 DIAGNOSIS — E860 Dehydration: Secondary | ICD-10-CM | POA: Diagnosis present

## 2014-11-29 DIAGNOSIS — K219 Gastro-esophageal reflux disease without esophagitis: Secondary | ICD-10-CM | POA: Diagnosis present

## 2014-11-29 DIAGNOSIS — R110 Nausea: Secondary | ICD-10-CM

## 2014-11-29 DIAGNOSIS — I10 Essential (primary) hypertension: Secondary | ICD-10-CM | POA: Diagnosis present

## 2014-11-29 DIAGNOSIS — Z882 Allergy status to sulfonamides status: Secondary | ICD-10-CM | POA: Diagnosis not present

## 2014-11-29 DIAGNOSIS — R739 Hyperglycemia, unspecified: Secondary | ICD-10-CM | POA: Diagnosis present

## 2014-11-29 DIAGNOSIS — M1712 Unilateral primary osteoarthritis, left knee: Secondary | ICD-10-CM | POA: Diagnosis present

## 2014-11-29 DIAGNOSIS — J189 Pneumonia, unspecified organism: Secondary | ICD-10-CM | POA: Diagnosis present

## 2014-11-29 DIAGNOSIS — R112 Nausea with vomiting, unspecified: Secondary | ICD-10-CM | POA: Diagnosis present

## 2014-11-29 LAB — URINALYSIS, ROUTINE W REFLEX MICROSCOPIC
Bilirubin Urine: NEGATIVE
Glucose, UA: NEGATIVE mg/dL
Hgb urine dipstick: NEGATIVE
Ketones, ur: 40 mg/dL — AB
Leukocytes, UA: NEGATIVE
Nitrite: NEGATIVE
Protein, ur: NEGATIVE mg/dL
Specific Gravity, Urine: 1.021 (ref 1.005–1.030)
Urobilinogen, UA: 1 mg/dL (ref 0.0–1.0)
pH: 6.5 (ref 5.0–8.0)

## 2014-11-29 LAB — CREATININE, SERUM
CREATININE: 0.91 mg/dL (ref 0.50–1.35)
GFR calc Af Amer: >90 mL/min (ref >90)
GFR calc non Af Amer: >90 mL/min (ref >90)

## 2014-11-29 LAB — PHOSPHORUS: Phosphorus: 2.1 mg/dL — ABNORMAL LOW (ref 2.3–4.6)

## 2014-11-29 LAB — COMPREHENSIVE METABOLIC PANEL
ALT: 15 U/L (ref 0–53)
AST: 18 U/L (ref 0–37)
Albumin: 3.4 g/dL — ABNORMAL LOW (ref 3.5–5.2)
Alkaline Phosphatase: 50 U/L (ref 39–117)
Anion gap: 14 (ref 5–15)
BUN: 11 mg/dL (ref 6–23)
CO2: 23 mEq/L (ref 19–32)
Calcium: 9.2 mg/dL (ref 8.4–10.5)
Chloride: 101 mEq/L (ref 96–112)
Creatinine, Ser: 0.99 mg/dL (ref 0.50–1.35)
GFR calc Af Amer: >90 mL/min (ref >90)
GFR calc non Af Amer: >90 mL/min (ref >90)
Glucose, Bld: 116 mg/dL — ABNORMAL HIGH (ref 70–99)
Potassium: 3.7 mEq/L (ref 3.7–5.3)
Sodium: 138 mEq/L (ref 137–147)
Total Bilirubin: 0.5 mg/dL (ref 0.3–1.2)
Total Protein: 6.6 g/dL (ref 6.0–8.3)

## 2014-11-29 LAB — CBC WITH DIFFERENTIAL/PLATELET
Basophils Absolute: 0 10*3/uL (ref 0.0–0.1)
Basophils Relative: 0 % (ref 0–1)
Eosinophils Absolute: 0.9 10*3/uL — ABNORMAL HIGH (ref 0.0–0.7)
Eosinophils Relative: 10 % — ABNORMAL HIGH (ref 0–5)
HCT: 42.3 % (ref 39.0–52.0)
Hemoglobin: 14.9 g/dL (ref 13.0–17.0)
Lymphocytes Relative: 20 % (ref 12–46)
Lymphs Abs: 1.7 10*3/uL (ref 0.7–4.0)
MCH: 31.4 pg (ref 26.0–34.0)
MCHC: 35.2 g/dL (ref 30.0–36.0)
MCV: 89.1 fL (ref 78.0–100.0)
Monocytes Absolute: 0.5 10*3/uL (ref 0.1–1.0)
Monocytes Relative: 6 % (ref 3–12)
Neutro Abs: 5.4 10*3/uL (ref 1.7–7.7)
Neutrophils Relative %: 64 % (ref 43–77)
Platelets: 292 10*3/uL (ref 150–400)
RBC: 4.75 MIL/uL (ref 4.22–5.81)
RDW: 12.4 % (ref 11.5–15.5)
WBC: 8.5 10*3/uL (ref 4.0–10.5)

## 2014-11-29 LAB — LIPID PANEL
CHOL/HDL RATIO: 4.9 ratio
CHOLESTEROL: 136 mg/dL (ref 0–200)
HDL: 28 mg/dL — ABNORMAL LOW (ref >39)
LDL CALC: 84 mg/dL (ref 0–99)
TRIGLYCERIDES: 118 mg/dL (ref <150)
VLDL: 24 mg/dL (ref 0–40)

## 2014-11-29 LAB — CBC
HEMATOCRIT: 39.6 % (ref 39.0–52.0)
Hemoglobin: 14 g/dL (ref 13.0–17.0)
MCH: 31.5 pg (ref 26.0–34.0)
MCHC: 35.4 g/dL (ref 30.0–36.0)
MCV: 89.2 fL (ref 78.0–100.0)
PLATELETS: 269 10*3/uL (ref 150–400)
RBC: 4.44 MIL/uL (ref 4.22–5.81)
RDW: 12.6 % (ref 11.5–15.5)
WBC: 8.5 10*3/uL (ref 4.0–10.5)

## 2014-11-29 LAB — MAGNESIUM: MAGNESIUM: 1.9 mg/dL (ref 1.5–2.5)

## 2014-11-29 LAB — TSH: TSH: 0.434 u[IU]/mL (ref 0.350–4.500)

## 2014-11-29 MED ORDER — ACETAMINOPHEN 325 MG PO TABS
650.0000 mg | ORAL_TABLET | Freq: Four times a day (QID) | ORAL | Status: DC | PRN
  Administered 2014-11-30: 650 mg via ORAL

## 2014-11-29 MED ORDER — LORATADINE 10 MG PO TABS
10.0000 mg | ORAL_TABLET | Freq: Every day | ORAL | Status: DC
  Administered 2014-11-29 – 2014-11-30 (×2): 10 mg via ORAL
  Filled 2014-11-29 (×2): qty 1

## 2014-11-29 MED ORDER — METHYLPREDNISOLONE SODIUM SUCC 40 MG IJ SOLR
40.0000 mg | Freq: Three times a day (TID) | INTRAMUSCULAR | Status: DC
  Administered 2014-11-29 – 2014-11-30 (×3): 40 mg via INTRAVENOUS
  Filled 2014-11-29 (×5): qty 1

## 2014-11-29 MED ORDER — ATORVASTATIN CALCIUM 40 MG PO TABS
40.0000 mg | ORAL_TABLET | Freq: Every day | ORAL | Status: DC
  Administered 2014-11-29: 40 mg via ORAL
  Filled 2014-11-29 (×2): qty 1

## 2014-11-29 MED ORDER — ACETAMINOPHEN 650 MG RE SUPP: 650.0000 mg | Freq: Four times a day (QID) | RECTAL | Status: DC | PRN

## 2014-11-29 MED ORDER — ALBUTEROL SULFATE (2.5 MG/3ML) 0.083% IN NEBU
2.5000 mg | INHALATION_SOLUTION | RESPIRATORY_TRACT | Status: DC | PRN
Start: 2014-11-29 — End: 2014-11-30

## 2014-11-29 MED ORDER — RAMIPRIL 10 MG PO CAPS
10.0000 mg | ORAL_CAPSULE | Freq: Every day | ORAL | Status: DC
  Administered 2014-11-29 – 2014-11-30 (×2): 10 mg via ORAL
  Filled 2014-11-29 (×2): qty 1

## 2014-11-29 MED ORDER — ONDANSETRON HCL 4 MG/2ML IJ SOLN: 4.0000 mg | Freq: Four times a day (QID) | INTRAMUSCULAR | Status: DC | PRN

## 2014-11-29 MED ORDER — SODIUM CHLORIDE 0.9 % IV BOLUS (SEPSIS)
2000.0000 mL | Freq: Once | INTRAVENOUS | Status: AC
  Administered 2014-11-29: 2000 mL via INTRAVENOUS

## 2014-11-29 MED ORDER — ONDANSETRON HCL 4 MG/2ML IJ SOLN
4.0000 mg | Freq: Once | INTRAMUSCULAR | Status: AC
  Administered 2014-11-29: 4 mg via INTRAVENOUS

## 2014-11-29 MED ORDER — METHYLPREDNISOLONE SODIUM SUCC 125 MG IJ SOLR
80.0000 mg | Freq: Once | INTRAMUSCULAR | Status: AC
  Administered 2014-11-29: 80 mg via INTRAVENOUS

## 2014-11-29 MED ORDER — DIPHENHYDRAMINE HCL 25 MG PO CAPS: 25.0000 mg | ORAL_CAPSULE | ORAL | Status: DC | PRN

## 2014-11-29 MED ORDER — PROMETHAZINE HCL 25 MG/ML IJ SOLN: 25.0000 mg | Freq: Four times a day (QID) | INTRAMUSCULAR | Status: DC | PRN

## 2014-11-29 MED ORDER — IPRATROPIUM-ALBUTEROL 0.5-2.5 (3) MG/3ML IN SOLN
3.0000 mL | Freq: Four times a day (QID) | RESPIRATORY_TRACT | Status: DC
  Administered 2014-11-29 – 2014-11-30 (×2): 3 mL via RESPIRATORY_TRACT

## 2014-11-29 MED ORDER — DIPHENHYDRAMINE HCL 25 MG PO TABS
25.0000 mg | ORAL_TABLET | ORAL | Status: DC | PRN
  Filled 2014-11-29: qty 1

## 2014-11-29 MED ORDER — PROMETHAZINE HCL 25 MG/ML IJ SOLN
12.5000 mg | Freq: Once | INTRAMUSCULAR | Status: AC
  Administered 2014-11-29: 12.5 mg via INTRAVENOUS
  Filled 2014-11-29: qty 1

## 2014-11-29 MED ORDER — ONDANSETRON HCL 4 MG PO TABS: 4.0000 mg | ORAL_TABLET | Freq: Four times a day (QID) | ORAL | Status: DC | PRN

## 2014-11-29 MED ORDER — GUAIFENESIN ER 600 MG PO TB12
600.0000 mg | ORAL_TABLET | Freq: Two times a day (BID) | ORAL | Status: DC
  Administered 2014-11-29 – 2014-11-30 (×2): 600 mg via ORAL
  Filled 2014-11-29 (×3): qty 1

## 2014-11-29 MED ORDER — DIPHENHYDRAMINE HCL 50 MG/ML IJ SOLN
25.0000 mg | Freq: Once | INTRAMUSCULAR | Status: AC
  Administered 2014-11-29: 25 mg via INTRAVENOUS

## 2014-11-29 MED ORDER — HEPARIN SODIUM (PORCINE) 5000 UNIT/ML IJ SOLN
5000.0000 [IU] | Freq: Three times a day (TID) | INTRAMUSCULAR | Status: DC
  Administered 2014-11-29 – 2014-11-30 (×3): 5000 [IU] via SUBCUTANEOUS
  Filled 2014-11-29 (×5): qty 1

## 2014-11-29 MED ORDER — IPRATROPIUM-ALBUTEROL 0.5-2.5 (3) MG/3ML IN SOLN
3.0000 mL | Freq: Once | RESPIRATORY_TRACT | Status: AC
  Administered 2014-11-29: 3 mL via RESPIRATORY_TRACT

## 2014-11-29 MED ORDER — SODIUM CHLORIDE 0.9 % IV SOLN
INTRAVENOUS | Status: DC
  Administered 2014-11-29: 19:00:00 via INTRAVENOUS

## 2014-11-29 MED ORDER — PANTOPRAZOLE SODIUM 40 MG IV SOLR
40.0000 mg | INTRAVENOUS | Status: DC
  Administered 2014-11-29: 40 mg via INTRAVENOUS
  Filled 2014-11-29 (×2): qty 40

## 2014-11-29 MED ORDER — BUDESONIDE 0.25 MG/2ML IN SUSP
0.2500 mg | Freq: Two times a day (BID) | RESPIRATORY_TRACT | Status: DC
  Administered 2014-11-29 – 2014-11-30 (×2): 0.25 mg via RESPIRATORY_TRACT
  Filled 2014-11-29: qty 2

## 2014-11-29 MED ORDER — LEVOFLOXACIN IN D5W 750 MG/150ML IV SOLN
750.0000 mg | INTRAVENOUS | Status: DC
  Administered 2014-11-29 – 2014-11-30 (×2): 750 mg via INTRAVENOUS
  Filled 2014-11-29 (×2): qty 150

## 2014-11-29 MED ORDER — POLYMYXIN B-TRIMETHOPRIM 10000-0.1 UNIT/ML-% OP SOLN
2.0000 [drp] | Freq: Four times a day (QID) | OPHTHALMIC | Status: DC
  Administered 2014-11-29 – 2014-11-30 (×2): 2 [drp] via OPHTHALMIC
  Filled 2014-11-29: qty 10

## 2014-11-29 NOTE — ED Provider Notes (Signed)
CSN: [id number]     Arrival date & time 11/29/14  1147 History   First MD Initiated Contact with Patient 11/29/14 1340     Chief Complaint  Patient presents with  . Emesis     (Consider location/radiation/quality/duration/timing/severity/associated sxs/prior Treatment) HPI   53yM with n/v. Recently seen in the ED for same. Reports symptoms of cough/congestion/wheezing starting about a week ago. Diagnosed with viral URI by PCP. Multiple family members with similar symptoms recently. Had outpt foot surgery on Tuesday. Reports had been doing well with regards to his foot since then. Since then has had persistent n/v and anorexia. NBNB emesis. Seen at [facility] ~36 hours ago. Diagnosed with possible pneumonia and started on Levaquin. Also prescribed phenergan. Despite this he reports he has been unable to keep anything down. Anorexia. Denies abdominal pain. No urinary complaints. No fever or chills. When seen in ED last had urticaria. Resolved with benadryl. Had reoccurence of hives again last night. No obvious precipitant. Has had some cough wheezing but this preceded the GI symptoms. Former smoker. No diagnosed hx of underlying lung disease.   Past Medical History  Diagnosis Date  . Hyperlipidemia   . Hypertension   . Pneumonia     hx of  . Kidney stones     "never had OR" (04/09/2014)  . Arthritis   . Osteoarthritis of left knee 04/09/2014   Past Surgical History  Procedure Laterality Date  . Nasal septum surgery  1981  . Replacement unicondylar joint knee Left 04/09/2014  . Tonsillectomy and adenoidectomy  1972  . Appendectomy  2007  . Cardiac catheterization  ~ 2003  . Knee arthroscopy Left 04/09/2014    Procedure: ARTHROSCOPY KNEE;  Surgeon: Eulas Post, MD;  Location: [facility] OR;  Service: Orthopedics;  Laterality: Left;  . Partial knee arthroplasty Left 04/09/2014    Procedure: UNICOMPARTMENTAL KNEE;  Surgeon: Eulas Post, MD;  Location: [facility] OR;  Service: Orthopedics;  Laterality:  Left;  . Foot neuroma surgery     Family History  Problem Relation Age of Onset  . Heart disease Mother   . Heart disease Father   . Heart disease Brother   . Heart disease Paternal Grandfather   . Colon cancer Neg Hx   . Esophageal cancer Neg Hx   . Rectal cancer Neg Hx   . Stomach cancer Neg Hx    History  Substance Use Topics  . Smoking status: Former Smoker -- 0.50 packs/day for 32 years    Types: Cigarettes  . Smokeless tobacco: Never Used     Comment: 04/09/2014 "quit smoking ~ 2008"  . Alcohol Use: 1.8 oz/week    3 Cans of beer per week    Review of Systems  All systems reviewed and negative, other than as noted in HPI.   Allergies  Percocet and Sulfa antibiotics  Home Medications   Prior to Admission medications   Medication Sig Start Date End Date Taking? Authorizing Provider  diphenhydrAMINE (BENADRYL) 25 MG tablet Take 2 tablets (50 mg total) by mouth every 6 (six) hours. Take 1-2 tablets every 6 hours x 2 days, then space out to an as needed basis 11/28/14  Yes Ethelda Chick, MD  hydrochlorothiazide (HYDRODIURIL) 25 MG tablet Take 1 tablet by mouth Daily. 04/12/12  Yes Historical Provider, MD  levofloxacin (LEVAQUIN) 500 MG tablet Take 1 tablet (500 mg total) by mouth daily. 11/28/14  Yes Ethelda Chick, MD  LIPITOR 40 MG tablet Take 40 mg by mouth  at bedtime.  04/17/12  Yes Historical Provider, MD  meloxicam (MOBIC) 15 MG tablet Take 15 mg by mouth daily.   Yes Historical Provider, MD  promethazine (PHENERGAN) 25 MG tablet Take 25 mg by mouth every 6 (six) hours as needed for nausea or vomiting (nausea & vomiting).   Yes Historical Provider, MD  ramipril (ALTACE) 10 MG capsule Take 1 capsule by mouth Daily. 04/12/12  Yes Historical Provider, MD   BP 138/90 mmHg  Pulse 95  Temp(Src) 98.8 F (37.1 C) (Oral)  Resp 18  SpO2 96% Physical Exam  Constitutional: He appears well-developed and well-nourished. No distress.  HENT:  Head: Normocephalic and  atraumatic.  Eyes: Conjunctivae are normal. Right eye exhibits no discharge. Left eye exhibits no discharge.  Neck: Neck supple.  Cardiovascular: Normal rate, regular rhythm and normal heart sounds.  Exam reveals no gallop and no friction rub.   No murmur heard. Pulmonary/Chest: Effort normal. No respiratory distress. He has wheezes.  Faint expiratory wheezing b/l. No tachypnea or accessory muscle usage. Speaks in complete sentences.   Abdominal: Soft. He exhibits no distension. There is no tenderness. There is no guarding.  Musculoskeletal: He exhibits no edema or tenderness.  L foot in bandage/boot. He reports he has no pain. Bandages dry. Afebrile. I elected not to remove them.   Neurological: He is alert.  Skin: Skin is warm and dry. No rash noted. He is not diaphoretic.  Psychiatric: He has a normal mood and affect. His behavior is normal. Thought content normal.  Nursing note and vitals reviewed.   ED Course  Procedures (including critical care time) Labs Review Labs Reviewed  CBC WITH DIFFERENTIAL - Abnormal; Notable for the following:    Eosinophils Relative 10 (*)    Eosinophils Absolute 0.9 (*)    All other components within normal limits  COMPREHENSIVE METABOLIC PANEL - Abnormal; Notable for the following:    Glucose, Bld 116 (*)    Albumin 3.4 (*)    All other components within normal limits  URINALYSIS, ROUTINE W REFLEX MICROSCOPIC    Imaging Review Dg Chest 2 View  11/28/2014   CLINICAL DATA:  Acute onset of shortness of breath, cough and congestion. Generalized weakness. Diffuse body rash. Personal history of smoking. Initial encounter.  EXAM: CHEST  2 VIEW  COMPARISON:  Chest radiograph performed 04/08/2014  FINDINGS: The lungs are hypoexpanded. Peribronchial thickening is noted. There is no evidence of pleural effusion or pneumothorax. Increased density of right infrahilar opacity likely reflects lung hypoexpansion, though an underlying mass cannot be entirely  excluded.  The heart is normal in size; the mediastinal contour is within normal limits. No acute osseous abnormalities are seen.  IMPRESSION: 1. Lungs hypoexpanded.  Peribronchial thickening noted. 2. Increased density of right infrahilar opacity likely reflects lung hypoexpansion, though an underlying mass cannot be entirely excluded. CT of the chest could be considered for further evaluation if the patient's symptoms persist.   Electronically Signed   By: Roanna RaiderJeffery  Chang M.D.   On: 11/28/2014 02:28     EKG Interpretation None      MDM   Final diagnoses:  Nausea  Intractable vomiting with nausea, vomiting of unspecified type   53yM with persistent nausea. Also cough/wheezing and intermittent urticaria. Course somewhat prolonged for typical viral GI illness. Consider potential allergic response with constellation of symptoms. No obvious trigger though. Not toxic. Not hypotensive. Hx of appendectomy but clinically not bowel obstruction. Consider ileus. Foot surgery though and no significant pain meds  since day of surgery. Having BM. Abdomen is pretty benign. I think reasonable to check some abdominal films though. IVF. Antiemetics. Some wheezing on exam, but speaking in complete sentences/no increased WOB. Currently on levaquin for questionable pneumonia on recent CXR.   Pt reports still feeling miserable. Blood work pretty unremarkable. Does have ketones on UA though. Imaging w/o signs of ileus/obstruction. Persistant symptoms though. Will discuss with medicine.     Raeford RazorStephen Wynton Hufstetler, MD 12/06/14 [phone]

## 2014-11-29 NOTE — ED Notes (Signed)
Pt had foot surgery on Tuesday. Ed visit on Thursday at midnight dx Pneumonia and given abx. Pt has had nausea and vomiting since Wedneday and reports that it has not gotten better and "I am feeling dehydrated and I can't keep anything down.".

## 2014-11-29 NOTE — H&P (Signed)
[facility] History and Physical  Brandon King NWG:[id number] DOB: March 18, 1961 DOA: 11/29/2014  Referring physician: Dr. Juleen China PCP: Delorse Lek, MD   Chief Complaint: SOB, cough (productive), wheezing and intractable N/V  HPI: Brandon King is a 53 y.o. male with PMH significant for HTN, HLD, allergic rhinitis, GERD and OA; presented to ED with approx 1 week of worsening SOB, wheezing, productive cough and general malaise. Patient was initially diagnosed with URI by PCP 4 days or so PTA; advise to use OTC remedies. Patient reports that symptoms never improved and in fact continue worsening. On 11/28/14 was seen at ED with same complaints and work up suggested CAP; patient was started on levaquin and discharge home. Patient over the last 48 hours has also experienced intractable nausea, vomiting and inability to keep medication or food down. ED work up demonstarted significant wheezing and mild dehydration. [facility] called to admit patient for further evaluation and treatment.   Review of Systems:  Negative except as otherwise mentioned on HPI.   Past Medical History  Diagnosis Date  . Hyperlipidemia   . Hypertension   . Pneumonia     hx of  . Kidney stones     "never had OR" (04/09/2014)  . Arthritis   . Osteoarthritis of left knee 04/09/2014   Past Surgical History  Procedure Laterality Date  . Nasal septum surgery  1981  . Replacement unicondylar joint knee Left 04/09/2014  . Tonsillectomy and adenoidectomy  1972  . Appendectomy  2007  . Cardiac catheterization  ~ 2003  . Knee arthroscopy Left 04/09/2014    Procedure: ARTHROSCOPY KNEE;  Surgeon: Eulas Post, MD;  Location: [facility] OR;  Service: Orthopedics;  Laterality: Left;  . Partial knee arthroplasty Left 04/09/2014    Procedure: UNICOMPARTMENTAL KNEE;  Surgeon: Eulas Post, MD;  Location: [facility] OR;  Service: Orthopedics;  Laterality: Left;  . Foot neuroma surgery     Social History:  reports that he has quit smoking.  His smoking use included Cigarettes. He has a 16 pack-year smoking history. He has never used smokeless tobacco. He reports that he drinks about 1.8 oz of alcohol per week. He reports that he does not use illicit drugs.  Allergies  Allergen Reactions  . Percocet [Oxycodone-Acetaminophen] Itching  . Sulfa Antibiotics Other (See Comments)    Mouth blisters    Family History  Problem Relation Age of Onset  . Heart disease Mother   . Heart disease Father   . Heart disease Brother   . Heart disease Paternal Grandfather   . Colon cancer Neg Hx   . Esophageal cancer Neg Hx   . Rectal cancer Neg Hx   . Stomach cancer Neg Hx      Prior to Admission medications   Medication Sig Start Date End Date Taking? Authorizing Provider  diphenhydrAMINE (BENADRYL) 25 MG tablet Take 2 tablets (50 mg total) by mouth every 6 (six) hours. Take 1-2 tablets every 6 hours x 2 days, then space out to an as needed basis 11/28/14  Yes Ethelda Chick, MD  hydrochlorothiazide (HYDRODIURIL) 25 MG tablet Take 1 tablet by mouth Daily. 04/12/12  Yes Historical Provider, MD  levofloxacin (LEVAQUIN) 500 MG tablet Take 1 tablet (500 mg total) by mouth daily. 11/28/14  Yes Ethelda Chick, MD  LIPITOR 40 MG tablet Take 40 mg by mouth at bedtime.  04/17/12  Yes Historical Provider, MD  meloxicam (MOBIC) 15 MG tablet Take 15 mg by mouth daily.  Yes Historical Provider, MD  promethazine (PHENERGAN) 25 MG tablet Take 25 mg by mouth every 6 (six) hours as needed for nausea or vomiting (nausea & vomiting).   Yes Historical Provider, MD  ramipril (ALTACE) 10 MG capsule Take 1 capsule by mouth Daily. 04/12/12  Yes Historical Provider, MD   Physical Exam: Filed Vitals:   11/29/14 1213 11/29/14 1451 11/29/14 1704  BP: 138/90 147/96 154/78  Pulse: 95 76 86  Temp: 98.8 F (37.1 C) 98.7 F (37.1 C) 97.6 F (36.4 C)  TempSrc: Oral Oral Oral  Resp: 18 20 20   SpO2: 96% 95% 95%    Wt Readings from Last 3 Encounters:  11/28/14  95.255 kg (210 lb)  04/09/14 90.266 kg (199 lb)  04/08/14 90.674 kg (199 lb 14.4 oz)    General:  No fever, feeling uncomfortable; with general malaise, mild SOB and nasal congestion Eyes: PERRL, normal lids, irises & right eye conjunctivitis  ENT: grossly normal hearing, dry MM, no erythema or exudates inside his mouth; patient with nasal congestion and drainage; no ears drainage Neck: no LAD, masses or thyromegaly, no JVd Cardiovascular: RRR, no m/r/g. No LE edema. Respiratory: diffuse exp wheezing, scattered rhonchi; no crackles Abdomen: soft, nt, nD; positive BS Skin: right palpebra irritation, left foot with clean dressing after minor foot surgery; wound is clean and w/o erythema. No petechiae Musculoskeletal: grossly normal tone BUE/BLE Psychiatric: grossly normal mood and affect, speech fluent and appropriate Neurologic: grossly non-focal.          Labs on Admission:  Basic Metabolic Panel:  Recent Labs Lab 11/28/14 0053 11/29/14 1244  NA 139 138  K 3.5* 3.7  CL 101 101  CO2 23 23  GLUCOSE 155* 116*  BUN 17 11  CREATININE 0.90 0.99  CALCIUM 9.0 9.2   Liver Function Tests:  Recent Labs Lab 11/29/14 1244  AST 18  ALT 15  ALKPHOS 50  BILITOT 0.5  PROT 6.6  ALBUMIN 3.4*   CBC:  Recent Labs Lab 11/28/14 0053 11/29/14 1244  WBC 7.8 8.5  NEUTROABS  --  5.4  HGB 15.6 14.9  HCT 43.8 42.3  MCV 89.6 89.1  PLT 271 292   Radiological Exams on Admission: Dg Chest 2 View  11/28/2014   CLINICAL DATA:  Acute onset of shortness of breath, cough and congestion. Generalized weakness. Diffuse body rash. Personal history of smoking. Initial encounter.  EXAM: CHEST  2 VIEW  COMPARISON:  Chest radiograph performed 04/08/2014  FINDINGS: The lungs are hypoexpanded. Peribronchial thickening is noted. There is no evidence of pleural effusion or pneumothorax. Increased density of right infrahilar opacity likely reflects lung hypoexpansion, though an underlying mass cannot be  entirely excluded.  The heart is normal in size; the mediastinal contour is within normal limits. No acute osseous abnormalities are seen.  IMPRESSION: 1. Lungs hypoexpanded.  Peribronchial thickening noted. 2. Increased density of right infrahilar opacity likely reflects lung hypoexpansion, though an underlying mass cannot be entirely excluded. CT of the chest could be considered for further evaluation if the patient's symptoms persist.   Electronically Signed   By: Roanna RaiderJeffery  Chang M.D.   On: 11/28/2014 02:28   Dg Abd 2 Views  11/29/2014   CLINICAL DATA:  Nausea and vomiting of 4 days duration.  EXAM: ABDOMEN - 2 VIEW  COMPARISON:  02/12/2010  FINDINGS: Bowel gas pattern does not show any evidence of ileus, obstruction or free air. Bowel sutures are evident in the right mid abdomen, related to previous appendectomy.  No worrisome calcifications or bony findings.  IMPRESSION: No acute finding by radiography.   Electronically Signed   By: Paulina FusiMark  Shogry M.D.   On: 11/29/2014 15:37    EKG:  None   Assessment/Plan 1-SOB and wheezing: appears to be secondary to CAP with underline COPD exacerbation  -patient had hx of tobacco abuse for many years (quit approx 10 years ago); has never had PFT's -more than 1 week with symptoms and worsening progression -will admit and treat with solumedrol, nebulizer, mucinex, flutter valve and antibiotics -patient will benefit of PFT's as an outpatient -will check urine strep antigen, legionella antigen in urine and sputum cx's  2-Nausea and vomiting: due to PNA vs viral gastroenteritis -will treat as mentioned above. -bowel rest with just clear liquid diet initially -gentle fluid resuscitation -follow clinical response  3-HYPERCHOLESTEROLEMIA:will check lipid panel -Continue lipitor  4-GERD without esophagitis: will use protonix  5-Allergic rhinitis: will treat with claritin and PRN benadryl  6-Benign essential HTN: will continue lisinopril -holding diuretics  given ongoing N/V and mild dehydration.  7-conjunctivitis: right eye -will start treatment with polymyxin eye drops   Code Status: Full DVT Prophylaxis:heparin  Family Communication: wife at [place] Disposition Plan: inpatient, LOS > 2 midnights; med-surg bed  Time spent: 50 minutes  Vassie LollMadera, Adrienne Trombetta [facility] Pager [phone]

## 2014-11-30 LAB — BASIC METABOLIC PANEL
Anion gap: 12 (ref 5–15)
BUN: 10 mg/dL (ref 6–23)
CHLORIDE: 107 meq/L (ref 96–112)
CO2: 24 mEq/L (ref 19–32)
Calcium: 8.8 mg/dL (ref 8.4–10.5)
Creatinine, Ser: 0.89 mg/dL (ref 0.50–1.35)
GFR calc Af Amer: >90 mL/min (ref >90)
Glucose, Bld: 156 mg/dL — ABNORMAL HIGH (ref 70–99)
Potassium: 3.7 mEq/L (ref 3.7–5.3)
SODIUM: 143 meq/L (ref 137–147)

## 2014-11-30 LAB — CBC
HCT: 37.9 % — ABNORMAL LOW (ref 39.0–52.0)
HEMOGLOBIN: 13.2 g/dL (ref 13.0–17.0)
MCH: 31.2 pg (ref 26.0–34.0)
MCHC: 34.8 g/dL (ref 30.0–36.0)
MCV: 89.6 fL (ref 78.0–100.0)
PLATELETS: 296 10*3/uL (ref 150–400)
RBC: 4.23 MIL/uL (ref 4.22–5.81)
RDW: 12.5 % (ref 11.5–15.5)
WBC: 6.9 10*3/uL (ref 4.0–10.5)

## 2014-11-30 LAB — HEMOGLOBIN A1C
HEMOGLOBIN A1C: 6 % — AB (ref <5.7)
Mean Plasma Glucose: 126 mg/dL — ABNORMAL HIGH (ref <117)

## 2014-11-30 LAB — HIV ANTIBODY (ROUTINE TESTING W REFLEX): HIV 1&2 Ab, 4th Generation: NONREACTIVE

## 2014-11-30 MED ORDER — GUAIFENESIN ER 600 MG PO TB12: 600.0000 mg | ORAL_TABLET | Freq: Two times a day (BID) | ORAL | Status: DC

## 2014-11-30 MED ORDER — PREDNISONE 20 MG PO TABS: ORAL_TABLET | ORAL | Status: DC

## 2014-11-30 MED ORDER — POLYMYXIN B-TRIMETHOPRIM 10000-0.1 UNIT/ML-% OP SOLN: 2.0000 [drp] | Freq: Four times a day (QID) | OPHTHALMIC | Status: DC

## 2014-11-30 MED ORDER — IPRATROPIUM-ALBUTEROL 20-100 MCG/ACT IN AERS: 1.0000 | INHALATION_SPRAY | Freq: Four times a day (QID) | RESPIRATORY_TRACT | Status: DC | PRN

## 2014-11-30 MED ORDER — LORATADINE 10 MG PO TABS: 10.0000 mg | ORAL_TABLET | Freq: Every day | ORAL | Status: DC

## 2014-11-30 MED ORDER — PANTOPRAZOLE SODIUM 40 MG PO TBEC: 40.0000 mg | DELAYED_RELEASE_TABLET | Freq: Every day | ORAL | Status: DC

## 2014-11-30 MED ORDER — ONDANSETRON 8 MG PO TBDP: 8.0000 mg | ORAL_TABLET | Freq: Three times a day (TID) | ORAL | Status: DC | PRN

## 2014-11-30 MED ORDER — IPRATROPIUM-ALBUTEROL 0.5-2.5 (3) MG/3ML IN SOLN
3.0000 mL | Freq: Two times a day (BID) | RESPIRATORY_TRACT | Status: DC
  Filled 2014-11-30: qty 3

## 2014-11-30 NOTE — Plan of Care (Signed)
Problem: Discharge Progression Outcomes Goal: Barriers To Progression Addressed/Resolved Outcome: Completed/Met Date Met:  11/30/14 Goal: Discharge plan in place and appropriate Outcome: Completed/Met Date Met:  11/30/14 Goal: O2 sats at patient's baseline Outcome: Completed/Met Date Met:  11/30/14 Goal: Pain controlled with appropriate interventions Outcome: Completed/Met Date Met:  11/30/14 Goal: Hemodynamically stable Outcome: Completed/Met Date Met:  58/83/25 Goal: Complications resolved/controlled Outcome: Completed/Met Date Met:  11/30/14 Goal: Tolerating diet Outcome: Completed/Met Date Met:  11/30/14 Goal: Activity appropriate for discharge plan Outcome: Completed/Met Date Met:  11/30/14 Goal: Vaccine documented on D/C instructions Outcome: Not Applicable Date Met:  49/82/64 Goal: Other Discharge Outcomes/Goals Outcome: Completed/Met Date Met:  11/30/14

## 2014-11-30 NOTE — Progress Notes (Signed)
Discharge instructions given to pt, verbalized understanding. Left the unit in stable condition. 

## 2014-11-30 NOTE — Plan of Care (Signed)
Problem: Phase II Progression Outcomes Goal: Pain controlled Outcome: Progressing Goal: Tolerating diet Outcome: Progressing

## 2014-11-30 NOTE — Discharge Summary (Signed)
Physician Discharge Summary  Brandon EvenerJames E King WUJ:[id number] DOB: 1961/10/01 DOA: 11/29/2014  PCP: Brandon LekBURNETT,Brandon King  Admit date: 11/29/2014 Discharge date: 11/30/2014  Time spent: >30 minutes  Recommendations for Outpatient Follow-up:  Reassess BP and adjust medication as needed Follow CBG's and A1C; patient advise to follow low carb diet See below for further recommendations  Discharge Diagnoses:  Principal Problem:   Nausea and vomiting Active Problems:   HYPERCHOLESTEROLEMIA   GERD without esophagitis   Allergic rhinitis   CAP (community acquired pneumonia)   Benign essential HTN hyperglycemia, no prior hx of diabetes  Discharge Condition: stable and improved. Will discharge home. Follow up with PCP in 10 days  Diet recommendation: low carbohydrates  Filed Weights   11/29/14 1813  Weight: 95.255 kg (210 lb)    History of present illness:  53 y.o. male with PMH significant for HTN, HLD, allergic rhinitis, GERD and OA; presented to ED with approx 1 week of worsening SOB, wheezing, productive cough and general malaise. Patient was initially diagnosed with URI by PCP 4 days or so PTA; advise to use OTC remedies. Patient reports that symptoms never improved and in fact continue worsening. On 11/28/14 was seen at ED with same complaints and work up suggested CAP; patient was started on levaquin and discharge home. Patient over the last 48 hours has also experienced intractable nausea, vomiting and inability to keep medication or food down. ED work up demonstarted significant wheezing and mild dehydration. [facility] called to admit patient for further evaluation and treatment.  Hospital Course:  1-SOB and wheezing: appears to be secondary to CAP with underline COPD exacerbation (undiagnosed) -patient had hx of tobacco abuse for many years (quit approx 10 years ago); has never had PFT's -more than 1 week with symptoms and worsening progression -improved with solumedrol, nebulizer  treatment and antibiotics -will discharge to complete abx therapy by mouth; steroids tapering treatment and discharge with combivent PRN -needs PFT's and symptoms failed to improved will performed CT chest instead of further x-rays   2-Nausea and vomiting: due to PNA vs viral gastroenteritis -will treat PNA as mentioned above. -symptoms improved/resolved with supportive care, fluid resuscitation and bowel rest -patient at discharge able to tolerate food and medications  3-HYPERCHOLESTEROLEMIA: -Continue lipitor  4-GERD without esophagitis:  -advise to minimize use of NSAID's -started on protonix; especially with need GI protection with use of prednisone at discharge  5-Allergic rhinitis:  -continue treatment with benadryl PRN and daily claritin  6-Benign essential HTN: -resume home antihypertensive regimen  7-conjunctivitis: right eye -will treat with polymyxin eye drops  8-hyperglycemia: A1C 6.0 -advise to follow low carb diet -PCP to follow blood sugar and A1C to determine right time for hypoglycemic treatment  Procedures:  See below for x-ray reports   Consultations:  None   Discharge Exam: Filed Vitals:   11/30/14 1456  BP: 140/74  Pulse: 85  Temp: 98.1 F (36.7 C)  Resp:     General: No fever, feeling much better, no CP and no SOB. Denies any further nausea or vomiting.   Eyes: PERRL, normal lids, irises & right eye conjunctivitis (improved)   ENT: grossly normal hearing, mild nasal congestion; no ears drainage  Neck: no LAD, masses or thyromegaly, no JVD  Cardiovascular: RRR, no m/r/g. No LE edema.  Respiratory: slight exp wheezing, scattered rhonchi; no crackles; improved air movement   Abdomen: soft, nt, ND; positive BS  Discharge Instructions You were cared for by a hospitalist during your hospital stay. If you  have any questions about your discharge medications or the care you received while you were in the hospital after you are discharged,  you can call the unit and asked to speak with the hospitalist on call if the hospitalist that took care of you is not available. Once you are discharged, your primary care physician will handle any further medical issues. Please note that NO REFILLS for any discharge medications will be authorized once you are discharged, as it is imperative that you return to your primary care physician (or establish a relationship with a primary care physician if you do not have one) for your aftercare needs so that they can reassess your need for medications and monitor your lab values.  Discharge Instructions    Diet - low sodium heart healthy    Complete by:  As directed      Discharge instructions    Complete by:  As directed   Keep yourself well hydrated Take medications as prescribed Arranged follow up with PCP in 10 days     Increase activity slowly    Complete by:  As directed           Current Discharge Medication List    START taking these medications   Details  guaiFENesin (MUCINEX) 600 MG 12 hr tablet Take 1 tablet (600 mg total) by mouth 2 (two) times daily. Qty: 30 tablet, Refills: 0    Ipratropium-Albuterol (COMBIVENT RESPIMAT) 20-100 MCG/ACT AERS respimat Inhale 1 puff into the lungs every 6 (six) hours as needed for wheezing or shortness of breath. Qty: 1 Inhaler, Refills: 2    loratadine (CLARITIN) 10 MG tablet Take 1 tablet (10 mg total) by mouth daily. Qty: 30 tablet, Refills: 1    pantoprazole (PROTONIX) 40 MG tablet Take 1 tablet (40 mg total) by mouth daily. Qty: 30 tablet, Refills: 1    predniSONE (DELTASONE) 20 MG tablet Take 3 tablets by mouth daily X 1 day; then 2 tablets by mouth daily X 2 days; then 1 tablet by mouth daily X 2 days; then 1/2 tablet by mouth daily X 2 days and stop prednisone Qty: 11 tablet, Refills: 0    trimethoprim-polymyxin b (POLYTRIM) ophthalmic solution Place 2 drops into both eyes every 6 (six) hours. Qty: 10 mL, Refills: 0      CONTINUE  these medications which have NOT CHANGED   Details  diphenhydrAMINE (BENADRYL) 25 MG tablet Take 2 tablets (50 mg total) by mouth every 6 (six) hours. Take 1-2 tablets every 6 hours x 2 days, then space out to an as needed basis Qty: 20 tablet, Refills: 0    hydrochlorothiazide (HYDRODIURIL) 25 MG tablet Take 1 tablet by mouth Daily.    levofloxacin (LEVAQUIN) 500 MG tablet Take 1 tablet (500 mg total) by mouth daily. Qty: 10 tablet, Refills: 0    LIPITOR 40 MG tablet Take 40 mg by mouth at bedtime.     meloxicam (MOBIC) 15 MG tablet Take 15 mg by mouth daily.    promethazine (PHENERGAN) 25 MG tablet Take 25 mg by mouth every 6 (six) hours as needed for nausea or vomiting (nausea & vomiting).    ramipril (ALTACE) 10 MG capsule Take 1 capsule by mouth Daily.       Allergies  Allergen Reactions  . Percocet [Oxycodone-Acetaminophen] Itching  . Sulfa Antibiotics Other (See Comments)    Mouth blisters   Follow-up Information    Follow up with BURNETT,Brandon King. Schedule an appointment as soon as possible  for a visit in 10 days.   Specialty:  Family Medicine   Contact information:   [address] [place] [address] [phone]        The results of significant diagnostics from this hospitalization (including imaging, microbiology, ancillary and laboratory) are listed below for reference.    Significant Diagnostic Studies: Dg Chest 2 View  11/28/2014   CLINICAL DATA:  Acute onset of shortness of breath, cough and congestion. Generalized weakness. Diffuse body rash. Personal history of smoking. Initial encounter.  EXAM: CHEST  2 VIEW  COMPARISON:  Chest radiograph performed 04/08/2014  FINDINGS: The lungs are hypoexpanded. Peribronchial thickening is noted. There is no evidence of pleural effusion or pneumothorax. Increased density of right infrahilar opacity likely reflects lung hypoexpansion, though an underlying mass cannot be entirely excluded.  The heart  is normal in size; the mediastinal contour is within normal limits. No acute osseous abnormalities are seen.  IMPRESSION: 1. Lungs hypoexpanded.  Peribronchial thickening noted. 2. Increased density of right infrahilar opacity likely reflects lung hypoexpansion, though an underlying mass cannot be entirely excluded. CT of the chest could be considered for further evaluation if the patient's symptoms persist.   Electronically Signed   By: Roanna Raider M.D.   On: 11/28/2014 02:28   Dg Abd 2 Views  11/29/2014   CLINICAL DATA:  Nausea and vomiting of 4 days duration.  EXAM: ABDOMEN - 2 VIEW  COMPARISON:  02/12/2010  FINDINGS: Bowel gas pattern does not show any evidence of ileus, obstruction or free air. Bowel sutures are evident in the right mid abdomen, related to previous appendectomy. No worrisome calcifications or bony findings.  IMPRESSION: No acute finding by radiography.   Electronically Signed   By: Paulina Fusi M.D.   On: 11/29/2014 15:37   Labs: Basic Metabolic Panel:  Recent Labs Lab 11/28/14 0053 11/29/14 1244 11/29/14 1855 11/30/14 0541  NA 139 138  --  143  K 3.5* 3.7  --  3.7  CL 101 101  --  107  CO2 23 23  --  24  GLUCOSE 155* 116*  --  156*  BUN 17 11  --  10  CREATININE 0.90 0.99 0.91 0.89  CALCIUM 9.0 9.2  --  8.8  MG  --   --  1.9  --   PHOS  --   --  2.1*  --    Liver Function Tests:  Recent Labs Lab 11/29/14 1244  AST 18  ALT 15  ALKPHOS 50  BILITOT 0.5  PROT 6.6  ALBUMIN 3.4*   CBC:  Recent Labs Lab 11/28/14 0053 11/29/14 1244 11/29/14 1855 11/30/14 0541  WBC 7.8 8.5 8.5 6.9  NEUTROABS  --  5.4  --   --   HGB 15.6 14.9 14.0 13.2  HCT 43.8 42.3 39.6 37.9*  MCV 89.6 89.1 89.2 89.6  PLT 271 292 269 296    Signed:  Vassie Loll  [facility] 11/30/2014, 5:10 PM

## 2014-12-02 LAB — RESPIRATORY VIRUS PANEL
ADENOVIRUS: NOT DETECTED
Influenza A H1: NOT DETECTED
Influenza A H3: NOT DETECTED
Influenza A: NOT DETECTED
Influenza B: NOT DETECTED
METAPNEUMOVIRUS: NOT DETECTED
PARAINFLUENZA 1 A: NOT DETECTED
PARAINFLUENZA 2 A: NOT DETECTED
PARAINFLUENZA 3 A: NOT DETECTED
RESPIRATORY SYNCYTIAL VIRUS B: NOT DETECTED
Respiratory Syncytial Virus A: NOT DETECTED
Rhinovirus: NOT DETECTED

## 2014-12-03 ENCOUNTER — Encounter: Payer: Self-pay | Admitting: Podiatry

## 2014-12-03 ENCOUNTER — Ambulatory Visit (INDEPENDENT_AMBULATORY_CARE_PROVIDER_SITE_OTHER): Payer: BC Managed Care – PPO | Admitting: Podiatry

## 2014-12-03 VITALS — BP 150/92 | HR 80 | Resp 16

## 2014-12-03 DIAGNOSIS — G5762 Lesion of plantar nerve, left lower limb: Secondary | ICD-10-CM

## 2014-12-03 DIAGNOSIS — G5782 Other specified mononeuropathies of left lower limb: Secondary | ICD-10-CM

## 2014-12-03 NOTE — Progress Notes (Signed)
Subjective:     Patient ID: Brandon King, male   DOB: 1961/05/08, 53 y.o.   MRN: [id number]  HPI patient states I'm doing fine with my left foot with the incision site healed well and no discomfort noted   Review of Systems     Objective:   Physical Exam Neurovascular status intact with incision site healing well left with a small area of irritation on the proximal portion with no drainage erythema or edema noted    Assessment:     Doing well post neurectomy third interspace left    Plan:     Reapplied sterile dressing advised on elevation and to reappoint as needed. Advised on gradual return soft shoe gear in 2 weeks and monitoring the foot and if any redness swelling drainage or other issues should occur to inform us immediately and come into the office

## 2014-12-06 LAB — CULTURE, BLOOD (ROUTINE X 2)
CULTURE: NO GROWTH
Culture: NO GROWTH

## 2014-12-26 ENCOUNTER — Encounter: Payer: Self-pay | Admitting: Podiatry

## 2015-01-08 ENCOUNTER — Encounter: Payer: Self-pay | Admitting: Podiatry

## 2015-12-28 DIAGNOSIS — G4730 Sleep apnea, unspecified: Secondary | ICD-10-CM

## 2015-12-28 HISTORY — DX: Sleep apnea, unspecified: G47.30

## 2016-12-29 DIAGNOSIS — G8929 Other chronic pain: Secondary | ICD-10-CM | POA: Diagnosis not present

## 2016-12-29 DIAGNOSIS — E782 Mixed hyperlipidemia: Secondary | ICD-10-CM | POA: Diagnosis not present

## 2016-12-29 DIAGNOSIS — I10 Essential (primary) hypertension: Secondary | ICD-10-CM | POA: Diagnosis not present

## 2016-12-29 DIAGNOSIS — M545 Low back pain: Secondary | ICD-10-CM | POA: Diagnosis not present

## 2017-01-14 DIAGNOSIS — H1045 Other chronic allergic conjunctivitis: Secondary | ICD-10-CM | POA: Diagnosis not present

## 2018-06-27 ENCOUNTER — Encounter (HOSPITAL_COMMUNITY): Payer: Self-pay

## 2018-06-27 ENCOUNTER — Emergency Department (HOSPITAL_COMMUNITY)
Admission: EM | Admit: 2018-06-27 | Discharge: 2018-06-27 | Disposition: A | Discharge status: Home / Self Care | Payer: BLUE CROSS/BLUE SHIELD | Attending: Emergency Medicine | Admitting: Emergency Medicine

## 2018-06-27 ENCOUNTER — Emergency Department (HOSPITAL_COMMUNITY): Payer: BLUE CROSS/BLUE SHIELD

## 2018-06-27 DIAGNOSIS — I10 Essential (primary) hypertension: Secondary | ICD-10-CM | POA: Insufficient documentation

## 2018-06-27 DIAGNOSIS — Z87891 Personal history of nicotine dependence: Secondary | ICD-10-CM | POA: Diagnosis not present

## 2018-06-27 DIAGNOSIS — R531 Weakness: Secondary | ICD-10-CM | POA: Diagnosis not present

## 2018-06-27 DIAGNOSIS — R55 Syncope and collapse: Secondary | ICD-10-CM | POA: Diagnosis not present

## 2018-06-27 DIAGNOSIS — R42 Dizziness and giddiness: Secondary | ICD-10-CM | POA: Insufficient documentation

## 2018-06-27 DIAGNOSIS — Z79899 Other long term (current) drug therapy: Secondary | ICD-10-CM | POA: Insufficient documentation

## 2018-06-27 DIAGNOSIS — R270 Ataxia, unspecified: Secondary | ICD-10-CM | POA: Diagnosis not present

## 2018-06-27 DIAGNOSIS — W19XXXA Unspecified fall, initial encounter: Secondary | ICD-10-CM | POA: Diagnosis not present

## 2018-06-27 LAB — CBC
HCT: 40.1 % (ref 39.0–52.0)
Hemoglobin: 13.3 g/dL (ref 13.0–17.0)
MCH: 30.6 pg (ref 26.0–34.0)
MCHC: 33.2 g/dL (ref 30.0–36.0)
MCV: 92.2 fL (ref 78.0–100.0)
PLATELETS: 228 10*3/uL (ref 150–400)
RBC: 4.35 MIL/uL (ref 4.22–5.81)
RDW: 12.1 % (ref 11.5–15.5)
WBC: 6.6 10*3/uL (ref 4.0–10.5)

## 2018-06-27 LAB — CBG MONITORING, ED: Glucose-Capillary: 99 mg/dL (ref 70–99)

## 2018-06-27 LAB — BASIC METABOLIC PANEL
Anion gap: 7 (ref 5–15)
BUN: 21 mg/dL — ABNORMAL HIGH (ref 6–20)
CHLORIDE: 104 mmol/L (ref 98–111)
CO2: 26 mmol/L (ref 22–32)
CREATININE: 1.24 mg/dL (ref 0.61–1.24)
Calcium: 8.9 mg/dL (ref 8.9–10.3)
GFR calc Af Amer: >60 mL/min (ref >60)
GFR calc non Af Amer: >60 mL/min (ref >60)
Glucose, Bld: 100 mg/dL — ABNORMAL HIGH (ref 70–99)
POTASSIUM: 3.6 mmol/L (ref 3.5–5.1)
Sodium: 137 mmol/L (ref 135–145)

## 2018-06-27 LAB — URINALYSIS, ROUTINE W REFLEX MICROSCOPIC
Bilirubin Urine: NEGATIVE
GLUCOSE, UA: NEGATIVE mg/dL
Hgb urine dipstick: NEGATIVE
Ketones, ur: NEGATIVE mg/dL
LEUKOCYTES UA: NEGATIVE
Nitrite: NEGATIVE
PROTEIN: NEGATIVE mg/dL
Specific Gravity, Urine: 1.006 (ref 1.005–1.030)
pH: 7 (ref 5.0–8.0)

## 2018-06-27 LAB — I-STAT TROPONIN, ED
Troponin i, poc: 0 ng/mL (ref 0.00–0.08)
Troponin i, poc: 0 ng/mL (ref 0.00–0.08)

## 2018-06-27 NOTE — ED Triage Notes (Signed)
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 He had been lifting a cast iron shower base.  bp's per FD 100/68 sitting 98/60 standing  EMS gave 450 ml NS 142/78 Pt has hx of high bp, took meds this morning.

## 2018-06-27 NOTE — ED Notes (Signed)
Patient transported to X-ray 

## 2018-06-27 NOTE — ED Notes (Signed)
Pt alert and oriented in NAD. Pt verbalized understanding of d/c instructions.  

## 2018-06-27 NOTE — ED Notes (Signed)
Patient transported to MRI 

## 2018-06-27 NOTE — Discharge Instructions (Signed)
Return for any new or worse symptoms.  Call neurology for follow-up for consideration for today's symptoms and for perhaps sleep apnea.  Also make an appointment follow-up with your regular doctor.  Recommend no work [employer] account executivetomorrow.  Extensive work-up here today without any specific findings.  To include an negative MRI brain.

## 2018-06-27 NOTE — ED Provider Notes (Signed)
[facility] EMERGENCY DEPARTMENT Provider Note   CSN: [id number] Arrival date & time: 06/27/18  1237     History   Chief Complaint Chief Complaint  Patient presents with  . Near Syncope    HPI Brandon King is a 57 y.o. male.  Patient sent in by EMS.  Patient went to the fire department at around 11:30 in the morning after a near fainting episode.  Patient had been lifting heavy cast iron shower base he works as a Nutritional therapist.  Was very heavy to take up the stairs he did grass and they put it in place a rested after but then after that even after resting he felt as if he was going to pass out a little bit of dizziness.  Similar symptoms on Sunday just while walking around [employer].  No true syncopal episode no chest pain no shortness of breath.  No speech problems no motor weakness no numbness.  No neck or back pain.  No abdominal pain no nausea vomiting or diarrhea.  No dysuria.     Past Medical History:  Diagnosis Date  . Arthritis   . Hyperlipidemia   . Hypertension   . Kidney stones    "never had OR" (04/09/2014)  . Osteoarthritis of left knee 04/09/2014  . Pneumonia    hx of    Patient Active Problem List   Diagnosis Date Noted  . Nausea and vomiting 11/29/2014  . CAP (community acquired pneumonia) 11/29/2014  . Benign essential HTN 11/29/2014  . Nausea with vomiting   . Osteoarthritis of left knee 04/09/2014  . Knee osteoarthritis 04/09/2014  . HYPERCHOLESTEROLEMIA 03/05/2008  . HYPERLIPIDEMIA 03/05/2008  . INTERNAL HEMORRHOIDS 03/05/2008  . GERD without esophagitis 03/05/2008  . Allergic rhinitis 03/05/2008    Past Surgical History:  Procedure Laterality Date  . APPENDECTOMY  2007  . CARDIAC CATHETERIZATION  ~ 2003  . FOOT NEUROMA SURGERY    . KNEE ARTHROSCOPY Left 04/09/2014   Procedure: ARTHROSCOPY KNEE;  Surgeon: Eulas Post, MD;  [facility] OR;  Service: Orthopedics;  Laterality: Left;  . NASAL SEPTUM SURGERY  1981  .  PARTIAL KNEE ARTHROPLASTY Left 04/09/2014   Procedure: UNICOMPARTMENTAL KNEE;  Surgeon: Eulas Post, MD;  Location: [facility] OR;  Service: Orthopedics;  Laterality: Left;  . REPLACEMENT UNICONDYLAR JOINT KNEE Left 04/09/2014  . TONSILLECTOMY AND ADENOIDECTOMY  1972        Home Medications    Prior to Admission medications   Medication Sig Start Date End Date Taking? Authorizing Provider  carvedilol (COREG) 12.5 MG tablet Take 12.5 mg by mouth 2 (two) times daily. 05/02/18  Yes [provider]  guaiFENesin (MUCINEX) 600 MG 12 hr tablet Take 1 tablet (600 mg total) by mouth 2 (two) times daily. Patient taking differently: Take 600 mg by mouth daily as needed.  11/30/14  Yes Vassie Loll, MD  hydrochlorothiazide (HYDRODIURIL) 25 MG tablet Take 1 tablet by mouth Daily. 04/12/12  Yes [provider]  LIPITOR 40 MG tablet Take 20 mg by mouth every other day.  04/17/12  Yes [provider]  meloxicam (MOBIC) 15 MG tablet Take 15 mg by mouth daily.   Yes [provider]  Multiple Vitamins-Minerals (CENTRUM SILVER 50+MEN) TABS Take 1 tablet by mouth daily.   Yes [provider]  ramipril (ALTACE) 10 MG capsule Take 1 capsule by mouth Daily. 04/12/12  Yes [provider]  diphenhydrAMINE (BENADRYL) 25 MG tablet Take 2 tablets (50 mg  total) by mouth every 6 (six) hours. Take 1-2 tablets every 6 hours x 2 days, then space out to an as needed basis Patient not taking: Reported on 06/27/2018 11/28/14   Phillis Haggis, MD  Ipratropium-Albuterol (COMBIVENT RESPIMAT) 20-100 MCG/ACT AERS respimat Inhale 1 puff into the lungs every 6 (six) hours as needed for wheezing or shortness of breath. Patient not taking: Reported on 06/27/2018 11/30/14   Vassie Loll, MD  levofloxacin (LEVAQUIN) 500 MG tablet Take 1 tablet (500 mg total) by mouth daily. Patient not taking: Reported on 06/27/2018 11/28/14   Phillis Haggis, MD  loratadine (CLARITIN) 10 MG tablet Take 1 tablet (10  mg total) by mouth daily. Patient not taking: Reported on 06/27/2018 11/30/14   Vassie Loll, MD  ondansetron (ZOFRAN ODT) 8 MG disintegrating tablet Take 1 tablet (8 mg total) by mouth every 8 (eight) hours as needed for nausea or vomiting. Patient not taking: Reported on 06/27/2018 11/30/14   Vassie Loll, MD  pantoprazole (PROTONIX) 40 MG tablet Take 1 tablet (40 mg total) by mouth daily. Patient not taking: Reported on 06/27/2018 11/30/14   Vassie Loll, MD  predniSONE (DELTASONE) 20 MG tablet Take 3 tablets by mouth daily X 1 day; then 2 tablets by mouth daily X 2 days; then 1 tablet by mouth daily X 2 days; then 1/2 tablet by mouth daily X 2 days and stop prednisone Patient not taking: Reported on 06/27/2018 11/30/14   Vassie Loll, MD  trimethoprim-polymyxin b (POLYTRIM) ophthalmic solution Place 2 drops into both eyes every 6 (six) hours. Patient not taking: Reported on 06/27/2018 11/30/14   Vassie Loll, MD    Family History Family History  Problem Relation Age of Onset  . Heart disease Mother   . Heart disease Father   . Heart disease Brother   . Heart disease Paternal Grandfather   . Colon cancer Neg Hx   . Esophageal cancer Neg Hx   . Rectal cancer Neg Hx   . Stomach cancer Neg Hx     Social History Social History   Tobacco Use  . Smoking status: Former Smoker    Packs/day: 0.50    Years: 32.00    Pack years: 16.00    Types: Cigarettes  . Smokeless tobacco: Never Used  . Tobacco comment: 04/09/2014 "quit smoking ~ 2008"  Substance Use Topics  . Alcohol use: Yes    Alcohol/week: 1.8 oz    Types: 3 Cans of beer per week  . Drug use: No     Allergies   Bee venom; Clarithromycin; Hydromorphone; Percocet [oxycodone-acetaminophen]; and Sulfa antibiotics   Review of Systems Review of Systems  Constitutional: Negative for fever.  HENT: Negative for congestion.   Eyes: Negative for visual disturbance.  Respiratory: Negative for shortness of breath.   Cardiovascular:  Negative for chest pain.  Gastrointestinal: Negative for abdominal pain, diarrhea, nausea and vomiting.  Genitourinary: Negative for dysuria.  Musculoskeletal: Negative for back pain and neck pain.  Skin: Negative for rash.  Neurological: Positive for dizziness. Negative for syncope, weakness, numbness and headaches.  Hematological: Does not bruise/bleed easily.  Psychiatric/Behavioral: Negative for confusion.     Physical Exam Updated Vital Signs BP 132/75   Pulse 69   Resp 17   Ht 1.778 m (5\' 10" )   Wt 99.8 kg (220 lb)   SpO2 95%   BMI 31.57 kg/m   Physical Exam  Constitutional: He is oriented to person, place, and time. He appears well-developed and well-nourished. No  distress.  HENT:  Head: Normocephalic and atraumatic.  Mouth/Throat: Oropharynx is clear and moist.  Eyes: Pupils are equal, round, and reactive to light. Conjunctivae and EOM are normal.  Neck: Normal range of motion. Neck supple.  Cardiovascular: Normal rate, regular rhythm and normal heart sounds.  Pulmonary/Chest: Effort normal and breath sounds normal.  Abdominal: Soft. Bowel sounds are normal.  Musculoskeletal: Normal range of motion.  Neurological: He is alert and oriented to person, place, and time. No cranial nerve deficit or sensory deficit. He exhibits normal muscle tone. Coordination normal.  Skin: No rash noted.  Nursing note and vitals reviewed.    ED Treatments / Results  Labs (all labs ordered are listed, but only abnormal results are displayed) Labs Reviewed  BASIC METABOLIC PANEL - Abnormal; Notable for the following components:      Result Value   Glucose, Bld 100 (*)    BUN 21 (*)    All other components within normal limits  CBC  URINALYSIS, ROUTINE W REFLEX MICROSCOPIC  CBG MONITORING, ED  I-STAT TROPONIN, ED  I-STAT TROPONIN, ED    EKG EKG Interpretation  Date/Time:  Tuesday June 27 2018 12:42:02 EDT Ventricular Rate:  59 PR Interval:    QRS Duration: 97 QT  Interval:  402 QTC Calculation: 399 R Axis:   21 Text Interpretation:  Sinus rhythm Abnormal R-wave progression, early transition Baseline wander in lead(s) V4 Confirmed by Vanetta MuldersZackowski, Morganne Haile [phone]([id number]) on 06/27/2018 12:52:05 PM   Radiology Dg Chest 2 View  Result Date: 06/27/2018 CLINICAL DATA:  Syncope EXAM: CHEST - 2 VIEW COMPARISON:  November 28, 2014 FINDINGS: The lungs are clear. The heart size and pulmonary vascularity are normal. No adenopathy. No pneumothorax. No bone lesions. IMPRESSION: No edema or consolidation. Electronically Signed   By: Bretta BangWilliam  Woodruff III M.D.   On: 06/27/2018 15:45   Mr Brain Wo Contrast (neuro Protocol)  Result Date: 06/27/2018 CLINICAL DATA:  Initial evaluation for acute ataxia. EXAM: MRI HEAD WITHOUT CONTRAST TECHNIQUE: Multiplanar, multiecho pulse sequences of the brain and surrounding structures were obtained without intravenous contrast. COMPARISON:  Prior CT from 03/14/2004. FINDINGS: Brain: Cerebral volume within normal limits for age. Mild scattered T2/FLAIR hyperintensities noted within the periventricular, deep, and subcortical white matter both cerebral hemispheres, nonspecific. No abnormal foci of restricted diffusion to suggest acute or subacute ischemia. Gray-white matter differentiation maintained. No evidence for chronic infarction. No acute or chronic intracranial hemorrhage. No mass lesion, midline shift or mass effect. No hydrocephalus. No extra-axial fluid collection. Normal pituitary gland. Vascular: Major intracranial vascular flow voids maintained. Skull and upper cervical spine: Craniocervical junction normal. Bone marrow signal intensity normal. No scalp soft tissue abnormality. Sinuses/Orbits: Globes and orbital soft tissues within normal limits. Scattered mucosal thickening throughout the ethmoidal air cells. Paranasal sinuses are otherwise clear. No mastoid effusion. Inner ear structures grossly normal. Other: None. IMPRESSION: 1. No acute  intracranial abnormality. 2. Mild cerebral white matter changes, nonspecific, but most commonly related to chronic small vessel ischemic disease. Primary differential considerations include sequelae of complicated migraine, prior infectious or inflammatory process, or vasculitis. These are not typical for underlying demyelinating disease. Electronically Signed   By: Rise MuBenjamin  McClintock M.D.   On: 06/27/2018 21:32    Procedures Procedures (including critical care time)  Medications Ordered in ED Medications - No data to display   Initial Impression / Assessment and Plan / ED Course  I have reviewed the triage vital signs and the nursing notes.  Pertinent labs & imaging results  that were available during my care of the patient were reviewed by me and considered in my medical decision making (see chart for details).     He is feeling better here.  Delta troponins negative patient's neuro exam without any significant abnormalities.  But opted to do orthostatic blood pressures which were fine.  Also walked him and actually as he made turns he felt a little dizzy.  This raise some concerns for perhaps a vertigo component.  Patient given the option for MRI brain to completely rule out any stroke that may have occurred in the last few days.  MRI was done and was negative.  Patient's work-up completely negative.  Patient was given referral to neurology there is some concerns according to his wife about sleep apnea.  Patient also follow back up with regular doctor.  Patient will return for any new or worse symptoms.  Patient stable for discharge home.  Patient works as a Nutritional therapist.  Re[facility].  Patient will stay at home and not work.  Final Clinical Impressions(s) / ED Diagnoses   Final diagnoses:  Near syncope  Dizziness    ED Discharge Orders    None       Vanetta Mulders, MD 06/27/18 2258

## 2018-06-27 NOTE — ED Notes (Signed)
Pt noticed a little dizziness at 180 degree turns when ambulating.

## 2018-06-27 NOTE — ED Notes (Signed)
Spoke with Irving BurtonEmily, PA to request discontinuing third troponin after two negative tests.

## 2018-06-27 NOTE — ED Notes (Signed)
Pt returned from MRI °

## 2018-07-05 ENCOUNTER — Other Ambulatory Visit: Payer: Self-pay | Admitting: Internal Medicine

## 2018-07-05 ENCOUNTER — Ambulatory Visit (INDEPENDENT_AMBULATORY_CARE_PROVIDER_SITE_OTHER): Payer: BLUE CROSS/BLUE SHIELD | Admitting: Internal Medicine

## 2018-07-05 ENCOUNTER — Encounter: Payer: Self-pay | Admitting: Internal Medicine

## 2018-07-05 ENCOUNTER — Other Ambulatory Visit (INDEPENDENT_AMBULATORY_CARE_PROVIDER_SITE_OTHER): Payer: BLUE CROSS/BLUE SHIELD

## 2018-07-05 ENCOUNTER — Ambulatory Visit (INDEPENDENT_AMBULATORY_CARE_PROVIDER_SITE_OTHER): Payer: BLUE CROSS/BLUE SHIELD

## 2018-07-05 VITALS — BP 122/70 | HR 79 | Ht 70.0 in | Wt 225.8 lb

## 2018-07-05 DIAGNOSIS — G4733 Obstructive sleep apnea (adult) (pediatric): Secondary | ICD-10-CM

## 2018-07-05 DIAGNOSIS — R0602 Shortness of breath: Secondary | ICD-10-CM

## 2018-07-05 DIAGNOSIS — I10 Essential (primary) hypertension: Secondary | ICD-10-CM

## 2018-07-05 DIAGNOSIS — R42 Dizziness and giddiness: Secondary | ICD-10-CM | POA: Diagnosis not present

## 2018-07-05 DIAGNOSIS — R55 Syncope and collapse: Secondary | ICD-10-CM

## 2018-07-05 DIAGNOSIS — R0609 Other forms of dyspnea: Secondary | ICD-10-CM | POA: Diagnosis not present

## 2018-07-05 LAB — TSH: TSH: 1.28 u[IU]/mL (ref 0.35–4.50)

## 2018-07-05 LAB — BRAIN NATRIURETIC PEPTIDE: Pro B Natriuretic peptide (BNP): 57 pg/mL (ref 0.0–100.0)

## 2018-07-05 MED ORDER — VALSARTAN 160 MG PO TABS: 160.0000 mg | ORAL_TABLET | Freq: Every day | ORAL | 11 refills | Status: AC

## 2018-07-05 NOTE — Progress Notes (Signed)
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  Returned to work 07/03/18 but still a bit if suddenly sit upright.      07/05/2018 1st [facility] office visit/ [name]   Chief Complaint  Patient presents with  . Consult    Patient reports feeling dizzy, SOB, and faint over last couple weeks while carrying some heavy cast iron. Remains SOB and notices dizziness with quick head movements and position changes.   Dyspnea:  MMRC1 = can walk nl pace, flat grade, can't hurry or go uphills or steps s sob   X sev months  Cough:  Sleep: on side or stomach/ loud snoring / daytime hypersomnolence with Epworth score = 16 SABA use: none    No obvious day to day or daytime variability or assoc excess/ purulent sputum or mucus plugs or hemoptysis or cp or chest tightness, subjective wheeze or overt sinus or hb symptoms.     Also denies any obvious fluctuation of symptoms with weather or environmental changes or other aggravating or alleviating factors except as outlined above   No unusual exposure hx or h/o childhood pna/ asthma or knowledge of premature birth.  Current Allergies, Complete Past Medical History, Past Surgical History, Family History, and Social History were reviewed in [employer] record.  ROS  The following are not active complaints unless bolded Hoarseness, sore throat, dysphagia, dental problems, itching, sneezing,  nasal congestion or discharge of excess mucus or purulent secretions, ear ache,   fever, chills, sweats, unintended wt loss or  wt gain, classically pleuritic or exertional cp,  orthopnea pnd or arm/hand swelling  or leg swelling, presyncope, palpitations, abdominal pain, anorexia, nausea, vomiting, diarrhea  or change in bowel habits or change in bladder habits, change in stools or change in urine, dysuria, hematuria,  rash, arthralgias, visual complaints, headache, numbness, weakness or ataxia or problems with walking or coordination,  change in mood or  memory.        Current Meds  Medication Sig  . carvedilol (COREG) 12.5 MG tablet Take 12.5 mg by mouth 2 (two) times daily.  . diphenhydrAMINE (BENADRYL) 25 MG tablet Take 2 tablets (50 mg total) by mouth every 6 (six) hours. Take 1-2 tablets every 6 hours x 2 days, then space out to an as needed basis  . hydrochlorothiazide (HYDRODIURIL) 25 MG tablet Take 1 tablet by mouth Daily.  Marland Kitchen LIPITOR 40 MG tablet Take 20 mg by mouth every other day.   . meloxicam (MOBIC) 15 MG tablet Take 15 mg by mouth daily.  . Multiple Vitamins-Minerals (CENTRUM SILVER 50+MEN) TABS Take 1 tablet by mouth daily.  . ramipril (ALTACE) 10 MG capsule Take 1 capsule by mouth Daily.  .         Past Medical History:  Diagnosis Date  . Arthritis   . Hyperlipidemia   . Hypertension   . Kidney stones    "never had OR" (04/09/2014)  . Osteoarthritis of left knee 04/09/2014  . Pneumonia  hx of          Objective:     BP 122/70   Ht 5\' 10"  (1.778 m)   Wt 225 lb 12.8 oz (102.4 kg)   SpO2 97%   BMI 32.40 kg/m   RA   slt hoarse wm with occ throat clearing / no orthostasis noted   HEENT: nl dentition, turbinates bilaterally, and oropharynx. Nl external ear canals without cough reflex Modified Mallampati Score =   2    NECK :  without JVD/Nodes/TM/ nl carotid upstrokes bilaterally   LUNGS: no acc muscle use,  Nl contour chest which is clear to A and P bilaterally without cough on insp or exp maneuvers   CV:  RRR  no s3 or murmur or increase in P2, and no edema   ABD:  ovese  soft and nontender with nl inspiratory excursion in the supine position. No bruits or organomegaly appreciated, bowel sounds nl  MS:  Nl gait/ ext warm without deformities, calf tenderness, cyanosis or clubbing No obvious joint restrictions   SKIN: warm and dry without lesions    NEURO:  alert, approp, nl sensorium with  no motor or cerebellar deficits apparent.           I personally reviewed images and agree with radiology impression as follows:  CXR:   06/27/18  No edema or consolidation.  Labs ordered/ reviewed:      Chemistry      Component Value Date/Time   NA 137 06/27/2018 1251   K 3.6 06/27/2018 1251   CL 104 06/27/2018 1251   CO2 26 06/27/2018 1251   BUN 21 (H) 06/27/2018 1251   CREATININE 1.24 06/27/2018 1251      Component Value Date/Time   CALCIUM 8.9 06/27/2018 1251   ALKPHOS 50 11/29/2014 1244   AST 18 11/29/2014 1244   ALT 15 11/29/2014 1244   BILITOT 0.5 11/29/2014 1244        Lab Results  Component Value Date   WBC 6.6 06/27/2018   HGB 13.3 06/27/2018   HCT 40.1 06/27/2018   MCV 92.2 06/27/2018   PLT 228 06/27/2018         Lab Results  Component Value Date   TSH 1.28 07/05/2018     Lab Results  Component Value Date   PROBNP 57.0 07/05/2018      Labs ordered 07/05/2018  D dimer       Assessment   No problem-specific Assessment & Plan notes found for this encounter.     Brandon HughsMichael Cal Gindlesperger, MD 07/05/2018

## 2018-07-05 NOTE — Patient Instructions (Addendum)
Stop altace   Start  diovan 160 mg daily   Please see patient coordinator before you leave today  to schedule holter monitor and sleep study    Please remember to go to the lab department downstairs in the basement  for your tests - we will call you with the results when they are available.  Avoid all straining if possible   Return first of next week to see NP for BP check sitting and standing

## 2018-07-06 ENCOUNTER — Encounter: Payer: Self-pay | Admitting: Internal Medicine

## 2018-07-06 DIAGNOSIS — G4733 Obstructive sleep apnea (adult) (pediatric): Secondary | ICD-10-CM | POA: Insufficient documentation

## 2018-07-06 LAB — D-DIMER, QUANTITATIVE (NOT AT ARMC): D DIMER QUANT: 0.32 ug{FEU}/mL (ref <0.50)

## 2018-07-06 NOTE — Assessment & Plan Note (Addendum)
In the best review of chronic cough to date ( NEJM 2016 375 [id number]) ,  ACEi are now felt to cause cough in up to  20% of pts which is a 4 fold increase from previous reports and does not include the variety of non-specific complaints we see in pulmonary clinic in pts on ACEi but previously attributed to another dx like  Copd/asthma and  include PNDS, throat and chest congestion, "bronchitis", unexplained dyspnea and noct "strangling" sensations, and hoarseness, but also  atypical /refractory GERD symptoms like dysphagia and "bad heartburn"   The only way I know  to prove this is not an "ACEi Case" is a trial off ACEi x a minimum of 6 weeks then regroup.   Try diovan 160 mg daily and return for bp check sitting and standing w/in a week

## 2018-07-06 NOTE — Assessment & Plan Note (Signed)
07/05/2018  Walked RA x 3 laps @ 185 ft each stopped due to  End of study, fast pace, no sob or desat   - Spirometry 07/05/2018  No significant obst/ min restriction  - trial off acei 07/05/2018   Symptoms are markedly disproportionate to objective findings and not clear to what extent this is actually a pulmonary  problem but pt does appear to have difficult to sort out respiratory symptoms of unknown origin for which  DDX  = almost all start with A and  include Adherence, Ace Inhibitors, Acid Reflux, Active Sinus Disease, Alpha 1 Antitripsin deficiency, Anxiety masquerading as Airways dz,  ABPA,  Allergy(esp in young), Aspiration (esp in elderly), Adverse effects of meds,  Active smokers, A bunch of PE's/clot burden (a few small clots can't cause this syndrome unless there is already severe underlying pulm or vascular dz with poor reserve),  Anemia or thyroid disorder, plus two Bs  = Bronchiectasis and Beta blocker use..and one C= CHF     Adherence is always the initial "prime suspect" and is a multilayered concern that requires a "trust but verify" approach in every patient - starting with knowing how to use medications, especially inhalers, correctly, keeping up with refills and understanding the fundamental difference between maintenance and prns vs those medications only taken for a very short course and then stopped and not refilled.  - return with all meds in hand using a trust but verify approach to confirm accurate Medication  Reconciliation The principal here is that until we are certain that the  patients are doing what we've asked, it makes no sense to ask them to do more.    ACEi adverse effects at the  top of the usual list of suspects(despite absence of classic cough - see hbp) and the only way to rule it out is a trial off > see hbp a/p     ? Allergy/ asthma > no assoc rhinitis or noct / early am symptoms argues against   ? Anxiety/depression  > usually at the bottom of this list of  usual suspects but may interfere with adherence and also interpretation of response or lack thereof to symptom management which can be quite subjective.  ? A bunch of PEs> unlikely based on today's walk and  while a normal  or high normal  D dimer value (seen commonly in the elderly or chronically ill)  may miss small peripheral pe, the clot burden with sob is moderately high and the d dimer  has a very high neg pred value if used in this setting.    ? chf > excluded today by bnp so low but could have arrhythmia contributing to some of his spells > rec 48 holter asap

## 2018-07-06 NOTE — Assessment & Plan Note (Signed)
Advised not to drive when sleepy, will do PSS as soon as feasible.

## 2018-07-09 NOTE — Progress Notes (Signed)
@Patient  ID: Brandon King, male    DOB: 1961-09-22, 57 y.o.   MRN: [id number]  Chief Complaint  Patient presents with  . Follow-up    bp check, no change    Referring provider: Delorse Lek, MD  HPI: 57 year old white male former smoker (quit in 2007).  With sudden onset of syncope in 06/25/2018.  Presyncope when looking up high at [employer].  This lasted 15 seconds and felt woozy the rest of the day.  Patient continued to work hard with strenuous activity following days.  Patient presented to emergency room with negative orthostatics and MRI.  Continue to work up to 07/03/2017.  Presents on 07/05/2018 for initial office visit. Works as a Nutritional therapist Former smoker Patient of Dr. Sherene Sires  Recent [facility] Encounters:   07/05/2018-initial office visit- Patient reports feeling dizzy, shortness of breath, faint over the last couple weeks.  Epworth score 16. Plan: Stop ACE, try Diovan 160 mg daily with 1 week blood pressure check, sleep study ordered, spirometry today no significant obstruction/minimal restriction, walk today, fast-paced no shortness of breath or desaturations, Holter monitor 48 hr  Tests:    07/05/2018-spirometry in office- mild restriction, no obstruction 07/05/2018 - Epworth 16 >>>Sleep study ordered >>>   Imaging:   06/27/2018-chest x-ray-no edema or consolidation, lungs are clear  Cardiac:  07/05/2018-Holter monitor 48-hour hookup >>>  Labs:  07/05/2018-d-dimer-0.32 07/05/2018-BNP-57 07/05/2018-TSH-1.28  Micro:   Chart Review:  06/27/2018-ER visit-near syncope [facility] Patient work-up completely negative, patient was given referral to neurology for potential sleep apnea. >>>06/27/2018-MRI brain without contrast- no acute intracranial abnormality, mild cerebral white matter changes, nonspecific, most commonly related to chronic small vessel ischemic disease      07/10/18  OV 57 year old patient seen in office today reporting no real change in symptoms  since being seen last week.  Patient reports that since starting Diovan he is noticed no change in symptoms.  As well as feels that things have been going well since starting taking it.  Patient still feels occasionally "woozy at times".  Most recently patient felt that way when he was drinking a beer.  Patient still with excessive daytime fatigue as well as with morning headaches.  Patient completed Holter monitor.  But has not heard back from those results.  Patient is also scheduled for home sleep study on 08/03/2018.  Plan is for patient to still go to [place] and was planned vacation.    Allergies  Allergen Reactions  . Bee Venom Anaphylaxis  . Clarithromycin Other (See Comments)    unknown  . Hydromorphone Other (See Comments)    Unknown   . Percocet [Oxycodone-Acetaminophen] Itching  . Sulfa Antibiotics Other (See Comments)    Mouth blisters     There is no immunization history on file for this patient.  Past Medical History:  Diagnosis Date  . Arthritis   . Hyperlipidemia   . Hypertension   . Kidney stones    "never had OR" (04/09/2014)  . Osteoarthritis of left knee 04/09/2014  . Pneumonia    hx of    Tobacco History: Social History   Tobacco Use  Smoking Status Former Smoker  . Packs/day: 0.50  . Years: 32.00  . Pack years: 16.00  . Types: Cigarettes  Smokeless Tobacco Never Used  Tobacco Comment   04/09/2014 "quit smoking ~ 2008"   Counseling given: Yes Comment: 04/09/2014 "quit smoking ~ 2008" Continue to avoid smoking.  Outpatient Encounter Medications as of 07/10/2018  Medication Sig  .  carvedilol (COREG) 12.5 MG tablet Take 12.5 mg by mouth 2 (two) times daily.  . diphenhydrAMINE (BENADRYL) 25 MG tablet Take 2 tablets (50 mg total) by mouth every 6 (six) hours. Take 1-2 tablets every 6 hours x 2 days, then space out to an as needed basis  . hydrochlorothiazide (HYDRODIURIL) 25 MG tablet Take 1 tablet by mouth Daily.  Marland Kitchen LIPITOR 40 MG tablet Take 20 mg  by mouth every other day.   . meloxicam (MOBIC) 15 MG tablet Take 15 mg by mouth daily.  . Multiple Vitamins-Minerals (CENTRUM SILVER 50+MEN) TABS Take 1 tablet by mouth daily.  . valsartan (DIOVAN) 160 MG tablet Take 1 tablet (160 mg total) by mouth daily.   No facility-administered encounter medications on file as of 07/10/2018.      Review of Systems  Review of Systems  Constitutional: Negative for activity change, chills, fatigue, fever and unexpected weight change.  HENT: Negative for postnasal drip, rhinorrhea, sinus pressure, sinus pain, sneezing and sore throat.   Respiratory: Negative for cough, shortness of breath and wheezing.   Cardiovascular: Negative for chest pain and palpitations.  Gastrointestinal: Negative for constipation, diarrhea, nausea and vomiting.  Genitourinary: Negative for hematuria and urgency.  Musculoskeletal: Negative for arthralgias.  Skin: Negative for color change.  Neurological: Negative for dizziness, seizures and headaches (morning head aches ).  Psychiatric/Behavioral: Negative for dysphoric mood. The patient is not nervous/anxious.   All other systems reviewed and are negative.     Physical Exam  BP 140/88   Pulse 68   Ht 5\' 10"  (1.778 m)   Wt 226 lb 6.4 oz (102.7 kg)   SpO2 96%   BMI 32.49 kg/m   Wt Readings from Last 5 Encounters:  07/10/18 226 lb 6.4 oz (102.7 kg)  07/05/18 225 lb 12.8 oz (102.4 kg)  06/27/18 220 lb (99.8 kg)  11/29/14 210 lb (95.3 kg)  11/28/14 210 lb (95.3 kg)     Physical Exam  Constitutional: He is oriented to person, place, and time and well-developed, well-nourished, and in no distress. No distress.  HENT:  Head: Normocephalic and atraumatic.  Right Ear: Hearing, tympanic membrane, external ear and ear canal normal.  Left Ear: Hearing, tympanic membrane, external ear and ear canal normal.  Nose: Nose normal. Right sinus exhibits no maxillary sinus tenderness and no frontal sinus tenderness. Left sinus  exhibits no maxillary sinus tenderness and no frontal sinus tenderness.  Mouth/Throat: Uvula is midline, oropharynx is clear and moist and mucous membranes are normal. No oropharyngeal exudate.  Mallampati 3  Eyes: Pupils are equal, round, and reactive to light.  Neck: Normal range of motion. Neck supple. No JVD present.  Cardiovascular: Normal rate, regular rhythm and normal heart sounds.  Pulmonary/Chest: Effort normal and breath sounds normal. No accessory muscle usage. No respiratory distress. He has no decreased breath sounds. He has no wheezes. He has no rhonchi.  Abdominal: Soft. Bowel sounds are normal. There is no tenderness.  Musculoskeletal: Normal range of motion. He exhibits no edema.  Lymphadenopathy:    He has no cervical adenopathy.  Neurological: He is alert and oriented to person, place, and time. Gait normal.  Skin: Skin is warm and dry. He is not diaphoretic. No erythema.  Psychiatric: Mood, memory, affect and judgment normal.  Nursing note and vitals reviewed.     Lab Results:  CBC    Component Value Date/Time   WBC 6.6 06/27/2018 1251   RBC 4.35 06/27/2018 1251   HGB 13.3  06/27/2018 1251   HCT 40.1 06/27/2018 1251   PLT 228 06/27/2018 1251   MCV 92.2 06/27/2018 1251   MCH 30.6 06/27/2018 1251   MCHC 33.2 06/27/2018 1251   RDW 12.1 06/27/2018 1251   LYMPHSABS 1.7 11/29/2014 1244   MONOABS 0.5 11/29/2014 1244   EOSABS 0.9 (H) 11/29/2014 1244   BASOSABS 0.0 11/29/2014 1244    BMET    Component Value Date/Time   NA 137 06/27/2018 1251   K 3.6 06/27/2018 1251   CL 104 06/27/2018 1251   CO2 26 06/27/2018 1251   GLUCOSE 100 (H) 06/27/2018 1251   BUN 21 (H) 06/27/2018 1251   CREATININE 1.24 06/27/2018 1251   CALCIUM 8.9 06/27/2018 1251   GFRNONAA >60 06/27/2018 1251   GFRAA >60 06/27/2018 1251    BNP No results found for: BNP  ProBNP    Component Value Date/Time   PROBNP 57.0 07/05/2018 1640    Imaging: Dg Chest 2 View  Result Date:  06/27/2018 CLINICAL DATA:  Syncope EXAM: CHEST - 2 VIEW COMPARISON:  November 28, 2014 FINDINGS: The lungs are clear. The heart size and pulmonary vascularity are normal. No adenopathy. No pneumothorax. No bone lesions. IMPRESSION: No edema or consolidation. Electronically Signed   By: Bretta BangWilliam  Woodruff III M.D.   On: 06/27/2018 15:45   Mr Brain Wo Contrast (neuro Protocol)  Result Date: 06/27/2018 CLINICAL DATA:  Initial evaluation for acute ataxia. EXAM: MRI HEAD WITHOUT CONTRAST TECHNIQUE: Multiplanar, multiecho pulse sequences of the brain and surrounding structures were obtained without intravenous contrast. COMPARISON:  Prior CT from 03/14/2004. FINDINGS: Brain: Cerebral volume within normal limits for age. Mild scattered T2/FLAIR hyperintensities noted within the periventricular, deep, and subcortical white matter both cerebral hemispheres, nonspecific. No abnormal foci of restricted diffusion to suggest acute or subacute ischemia. Gray-white matter differentiation maintained. No evidence for chronic infarction. No acute or chronic intracranial hemorrhage. No mass lesion, midline shift or mass effect. No hydrocephalus. No extra-axial fluid collection. Normal pituitary gland. Vascular: Major intracranial vascular flow voids maintained. Skull and upper cervical spine: Craniocervical junction normal. Bone marrow signal intensity normal. No scalp soft tissue abnormality. Sinuses/Orbits: Globes and orbital soft tissues within normal limits. Scattered mucosal thickening throughout the ethmoidal air cells. Paranasal sinuses are otherwise clear. No mastoid effusion. Inner ear structures grossly normal. Other: None. IMPRESSION: 1. No acute intracranial abnormality. 2. Mild cerebral white matter changes, nonspecific, but most commonly related to chronic small vessel ischemic disease. Primary differential considerations include sequelae of complicated migraine, prior infectious or inflammatory process, or vasculitis.  These are not typical for underlying demyelinating disease. Electronically Signed   By: Rise MuBenjamin  McClintock M.D.   On: 06/27/2018 21:32     Assessment & Plan:   Pleasant 57 year old patient seen office today for continued work-up of dyspnea as well as for blood pressure recheck.  Blood pressure is currently controlled on Diovan although patient needs further follow-up with primary care regarding chronic management of hypertension.  I discussed this extensively with patient and he plans on purchasing a new blood pressure cuff recording those results as well as following up with primary care to slowly work to get better control of blood pressure.  I do suspect the patient does have obstructive sleep apnea due to his daytime fatigue, structure of his throat, and morning headaches.  Patient to proceed forward with home sleep study.  Discussed with him options of therapy such as CPAP.  Patient follow-up with our office in 3 months.  Patient can  proceed forward with [place] vacation as planned pending Holter monitor results.  Benign essential HTN Can get BP machine  >>>Bring to PCP to compare   Follow up with PCP in 1 month to address chronic management of blood pressure    08/03/18 - home sleep study  >>> Complete this study  Follow-up in our office in 3 months  OSA (obstructive sleep apnea)   08/03/18 - home sleep study  >>> Complete this study  Follow-up in our office in 3 months     Coral Ceo, NP 07/10/2018

## 2018-07-10 ENCOUNTER — Encounter: Payer: Self-pay | Admitting: Pulmonary Disease

## 2018-07-10 ENCOUNTER — Ambulatory Visit (INDEPENDENT_AMBULATORY_CARE_PROVIDER_SITE_OTHER): Payer: BLUE CROSS/BLUE SHIELD | Admitting: Pulmonary Disease

## 2018-07-10 DIAGNOSIS — I10 Essential (primary) hypertension: Secondary | ICD-10-CM

## 2018-07-10 DIAGNOSIS — G4733 Obstructive sleep apnea (adult) (pediatric): Secondary | ICD-10-CM

## 2018-07-10 NOTE — Assessment & Plan Note (Signed)
Can get BP machine  >>>Bring to PCP to compare   Follow up with PCP in 1 month to address chronic management of blood pressure    08/03/18 - home sleep study  >>> Complete this study  Follow-up in our office in 3 months

## 2018-07-10 NOTE — Patient Instructions (Addendum)
Can get BP machine  >>>Bring to PCP to compare   Follow up with PCP in 1 month to address chronic management of blood pressure    08/03/18 - home sleep study  >>> Complete this study  Follow-up in our office in 3 months   Please contact the office if your symptoms worsen or you have concerns that you are not improving.   Thank you for choosing [facility] for your healthcare, and for allowing us to partner with you on your healthcare journey. I am thankful to be able to provide care to you today.   Elisha HeadlandBrian Lylah Lantis FNP-C

## 2018-07-10 NOTE — Assessment & Plan Note (Signed)
   08/03/18 - home sleep study  >>> Complete this study  Follow-up in our office in 3 months

## 2018-07-11 NOTE — Progress Notes (Signed)
Spoke with pt and notified of results per Dr. Wert. Pt verbalized understanding and denied any questions. 

## 2018-07-11 NOTE — Progress Notes (Signed)
Chart and office note reviewed in detail along with available xrays/ labs > agree with a/p as outlined  I reviewed his holter also and it does not show any worrisome findings

## 2018-07-18 ENCOUNTER — Other Ambulatory Visit: Payer: Self-pay | Admitting: Internal Medicine

## 2018-07-18 DIAGNOSIS — G4733 Obstructive sleep apnea (adult) (pediatric): Secondary | ICD-10-CM

## 2018-07-31 DIAGNOSIS — H16203 Unspecified keratoconjunctivitis, bilateral: Secondary | ICD-10-CM | POA: Diagnosis not present

## 2018-08-03 ENCOUNTER — Encounter (HOSPITAL_BASED_OUTPATIENT_CLINIC_OR_DEPARTMENT_OTHER): Payer: 59

## 2018-08-04 DIAGNOSIS — H16203 Unspecified keratoconjunctivitis, bilateral: Secondary | ICD-10-CM | POA: Diagnosis not present

## 2018-08-14 ENCOUNTER — Other Ambulatory Visit (INDEPENDENT_AMBULATORY_CARE_PROVIDER_SITE_OTHER): Payer: BLUE CROSS/BLUE SHIELD

## 2018-08-14 ENCOUNTER — Ambulatory Visit: Payer: BLUE CROSS/BLUE SHIELD | Admitting: Primary Care

## 2018-08-14 ENCOUNTER — Telehealth: Payer: Self-pay | Admitting: Internal Medicine

## 2018-08-14 ENCOUNTER — Encounter: Payer: Self-pay | Admitting: Primary Care

## 2018-08-14 VITALS — BP 132/86 | HR 70 | Ht 70.0 in | Wt 231.0 lb

## 2018-08-14 DIAGNOSIS — I10 Essential (primary) hypertension: Secondary | ICD-10-CM | POA: Diagnosis not present

## 2018-08-14 DIAGNOSIS — M7989 Other specified soft tissue disorders: Secondary | ICD-10-CM

## 2018-08-14 DIAGNOSIS — G4733 Obstructive sleep apnea (adult) (pediatric): Secondary | ICD-10-CM

## 2018-08-14 LAB — HEPATIC FUNCTION PANEL
ALBUMIN: 4.3 g/dL (ref 3.5–5.2)
ALT: 25 U/L (ref 0–53)
AST: 16 U/L (ref 0–37)
Alkaline Phosphatase: 49 U/L (ref 39–117)
Bilirubin, Direct: 0.1 mg/dL (ref 0.0–0.3)
TOTAL PROTEIN: 6.8 g/dL (ref 6.0–8.3)
Total Bilirubin: 0.5 mg/dL (ref 0.2–1.2)

## 2018-08-14 LAB — BASIC METABOLIC PANEL
BUN: 15 mg/dL (ref 6–23)
CO2: 32 mEq/L (ref 19–32)
Calcium: 9.5 mg/dL (ref 8.4–10.5)
Chloride: 102 mEq/L (ref 96–112)
Creatinine, Ser: 1.09 mg/dL (ref 0.40–1.50)
GFR: 73.51 mL/min (ref >60.00)
Glucose, Bld: 114 mg/dL — ABNORMAL HIGH (ref 70–99)
POTASSIUM: 3.5 meq/L (ref 3.5–5.1)
Sodium: 139 mEq/L (ref 135–145)

## 2018-08-14 LAB — BRAIN NATRIURETIC PEPTIDE: Pro B Natriuretic peptide (BNP): 35 pg/mL (ref 0.0–100.0)

## 2018-08-14 MED ORDER — FUROSEMIDE 20 MG PO TABS: ORAL_TABLET | ORAL | 0 refills | Status: DC

## 2018-08-14 NOTE — Telephone Encounter (Signed)
Called [facility] and spoke with pharmacist Steele Sizerolly who reported that patient informed her that he has been having some LE edema since beginning Valsartan 160mg , typically toward the end of the day.  Steele SizerDolly reported that patient also told her that he had traveled recently to [place].  Rx was given by Dr Sherene SiresWert at the 7.10.19 when Altace was changed to Valsartan 160mg .  LMOM TCB x1 to discuss with patient.

## 2018-08-14 NOTE — Assessment & Plan Note (Addendum)
-   Controlled; BP 132/86 right arm by provider recheck - Complains of bilateral ankle swelling since starting valsartan 160mg   - Not a known/typical side effect of medication, ok to continue - Checking labs  - Needs to follow-up with PCP - Needs to check BP at home and report if >160/90

## 2018-08-14 NOTE — Telephone Encounter (Signed)
Prefer he come in and let NP check bp /bmet but if declines to do so now then  ok to rx hydrodiuril 25 mg now and qam then return in 1-2 weeks for same

## 2018-08-14 NOTE — Assessment & Plan Note (Addendum)
-   New; reports bilateral leg swelling after starting Valsartan - Not clinically suspicious for DVT d/t being bilateral vs unilateral. Also no warmth or erythema on exam.  - Kidney function, LFTs and BNP wnl - Ok to continue BP medication - Add lasix 20mg  prn leg swelling  - Enc leg elevation and compression stockings - Cardiologist Dr. Benna DunksBarber in [place]

## 2018-08-14 NOTE — Progress Notes (Signed)
 @Patient  ID: Brandon EvenerJames E Loudin, male    DOB: 09-Jan-1961, 57 y.o.   MRN: [id number]  Chief Complaint  Patient presents with  . Acute Visit    MW pt being treated for DOE, HTN- states that changing from ramipril to valsartan pt has noticed b/l LE swelling worse qpm.     Referring provider: Barbie BannerWilson, Fred H, MD  HPI: 57 year old male, former smoker. PMH HTN, OSA, GERD. Patient of Dr. Sherene SiresWert, last seen on 07/10 for syncope. ACEi was discontinued and he was started on Diovan 160mg  for blood pressure. Most recently seen by NP on 7/15 for follow-up visit.   08/14/2018 Presents today for acute visit complaining of bilateral leg swelling, worse in the evening. No changes to diet or activity. New to valsartan, states that his pharmacist told him to speak with provider about medication. He traveled to [place] 2 weeks ago. Denies sob, cough, redness or warmth. PCP is Dr. Andrey CampanileWilson, he has not contact his office or set up follow-up apt. He has not been checking BP at home or bought a new machine.   Pulmonary tests: 06/27/2018-chest x-ray-no edema or consolidation, lungs are clear 07/05/2018-spirometry in office- mild restriction, no obstruction  Cardiac test:  07/05/2018-Holter monitor 48-hour hookup >>>  Labs:  07/05/2018-d-dimer-0.32 07/05/2018-BNP-57 07/05/2018-TSH-1.28  07/05/2018 - Epworth 16 >>>Sleep study ordered >>>  Significant events:  06/27/2018-[facility], patient was given referral to neurology for potential sleep apnea. >>>06/27/2018-MRI brain without contrast- no acute intracranial abnormality, mild cerebral white matter changes, nonspecific, most commonly related to chronic small vessel ischemic disease       Allergies  Allergen Reactions  . Bee Venom Anaphylaxis  . Clarithromycin Other (See Comments)    unknown  . Hydromorphone Other (See Comments)    Unknown   . Percocet [Oxycodone-Acetaminophen] Itching  . Sulfa Antibiotics  Other (See Comments)    Mouth blisters    Immunization History  Administered Date(s) Administered  . Tdap 04/06/2012    Past Medical History:  Diagnosis Date  . Arthritis   . Hyperlipidemia   . Hypertension   . Kidney stones    "never had OR" (04/09/2014)  . Osteoarthritis of left knee 04/09/2014  . Pneumonia    hx of    Tobacco History: Social History   Tobacco Use  Smoking Status Former Smoker  . Packs/day: 0.50  . Years: 32.00  . Pack years: 16.00  . Types: Cigarettes  Smokeless Tobacco Never Used  Tobacco Comment   04/09/2014 "quit smoking ~ 2008"   Counseling given: Not Answered Comment: 04/09/2014 "quit smoking ~ 2008"   Outpatient Medications Prior to Visit  Medication Sig Dispense Refill  . carvedilol (COREG) 12.5 MG tablet Take 12.5 mg by mouth 2 (two) times daily.  3  . diphenhydrAMINE (BENADRYL) 25 MG tablet Take 2 tablets (50 mg total) by mouth every 6 (six) hours. Take 1-2 tablets every 6 hours x 2 days, then space out to an as needed basis 20 tablet 0  . hydrochlorothiazide (HYDRODIURIL) 25 MG tablet Take 1 tablet by mouth Daily.    Marland Kitchen. LIPITOR 40 MG tablet Take 20 mg by mouth every other day.     . meloxicam (MOBIC) 15 MG tablet Take 15 mg by mouth daily.    . Multiple Vitamins-Minerals (CENTRUM SILVER 50+MEN) TABS Take 1 tablet by mouth daily.    . valsartan (DIOVAN) 160 MG tablet Take 1 tablet (160 mg total) by mouth daily. 30  tablet 11   No facility-administered medications prior to visit.     Review of Systems  Review of Systems  Constitutional: Negative.   HENT: Negative.   Respiratory: Negative.   Cardiovascular: Positive for leg swelling.    Physical Exam  BP 132/86 (BP Location: Left Arm, Patient Position: Sitting)   Pulse 70   Ht 5\' 10"  (1.778 m)   Wt 231 lb (104.8 kg)   SpO2 96%   BMI 33.15 kg/m  Physical Exam  Constitutional: He is oriented to person, place, and time. He appears well-developed and well-nourished.  HENT:    Head: Normocephalic and atraumatic.  Neck: Normal range of motion. Neck supple.  Cardiovascular: Normal rate and regular rhythm.  Trace BLE edema, mild pitting   Pulmonary/Chest: Effort normal and breath sounds normal.  Musculoskeletal: Normal range of motion.  Neurological: He is alert and oriented to person, place, and time.  Skin: Skin is warm and dry.  Psychiatric: He has a normal mood and affect. His behavior is normal. Judgment and thought content normal.     Lab Results:  CBC    Component Value Date/Time   WBC 6.6 06/27/2018 1251   RBC 4.35 06/27/2018 1251   HGB 13.3 06/27/2018 1251   HCT 40.1 06/27/2018 1251   PLT 228 06/27/2018 1251   MCV 92.2 06/27/2018 1251   MCH 30.6 06/27/2018 1251   MCHC 33.2 06/27/2018 1251   RDW 12.1 06/27/2018 1251   LYMPHSABS 1.7 11/29/2014 1244   MONOABS 0.5 11/29/2014 1244   EOSABS 0.9 (H) 11/29/2014 1244   BASOSABS 0.0 11/29/2014 1244    BMET    Component Value Date/Time   NA 139 08/14/2018 1551   K 3.5 08/14/2018 1551   CL 102 08/14/2018 1551   CO2 32 08/14/2018 1551   GLUCOSE 114 (H) 08/14/2018 1551   BUN 15 08/14/2018 1551   CREATININE 1.09 08/14/2018 1551   CALCIUM 9.5 08/14/2018 1551   GFRNONAA >60 06/27/2018 1251   GFRAA >60 06/27/2018 1251    BNP No results found for: BNP  ProBNP    Component Value Date/Time   PROBNP 35.0 08/14/2018 1551    Imaging: No results found.   Assessment & Plan:   OSA (obstructive sleep apnea) - States that he falls asleep after sitting down within 2 mins. Reports waking up multiple times throughout the night and that he snores a lot   - [facility] patient [facility] with no response   Benign essential HTN - Controlled; BP 132/86 right arm by provider recheck - Complains of bilateral ankle swelling since starting valsartan 160mg   - Not a known/typical side effect of medication, ok to continue - Checking labs  - Needs to follow-up  with PCP - Needs to check BP at home and report if >160/90   Leg swelling - New; reports bilateral leg swelling after starting Valsartan - Not clinically suspicious for DVT d/t being bilateral vs unilateral. Also no warmth or erythema on exam.  - Kidney function, LFTs and BNP wnl - Ok to continue BP medication - Add lasix 20mg  prn leg swelling  - Enc leg elevation and compression stockings - Cardi[place] [place] [facility] [place]      Severo Beber W Kamal Jurgens, NP 08/14/2018

## 2018-08-14 NOTE — Telephone Encounter (Signed)
Called and spoke with patient regarding MW recommendations Pt verbalized understanding, had no further questions nor concerns Scheduled appt with NP BW today at 3pm Nothing further needed.

## 2018-08-14 NOTE — Patient Instructions (Addendum)
Home sleep study please  Continue Diovan at this time. Leg swelling is not typical side effect from medication  Labs today, will call tomorrow with results (checking kidney function and BNP level)  Follow up with PCP for blood pressure management   Check BP at home, if >160/90 please return to office

## 2018-08-14 NOTE — Assessment & Plan Note (Signed)
-   States that he falls asleep after sitting down within 2 mins. Reports waking up multiple times throughout the night and that he snores a lot   - [facility] has [facility] contacting patient multiple times with no response

## 2018-08-14 NOTE — Telephone Encounter (Signed)
Patient returned call Spoke with patient who reported some bilateral LE edema in the ankles going up to his shins since beginning the Valsartan 160mg .  Pt unable to confirm any additional swelling in the hands, face, stomach.  Patient denies any dyspnea, chest pain, tightness, difficulty swallowing, etc  Patient has not checked his BP at home but denies any headache and feels BP has been fine  Dr Sherene SiresWert please advise, thank you.

## 2018-08-21 NOTE — Progress Notes (Signed)
Chart and office note reviewed in detail  > agree with a/p as outlined    

## 2018-08-22 DIAGNOSIS — G4733 Obstructive sleep apnea (adult) (pediatric): Secondary | ICD-10-CM | POA: Diagnosis not present

## 2018-08-23 ENCOUNTER — Telehealth: Payer: Self-pay | Admitting: Pulmonary Disease

## 2018-08-23 ENCOUNTER — Other Ambulatory Visit: Payer: Self-pay | Admitting: *Deleted

## 2018-08-23 DIAGNOSIS — G4733 Obstructive sleep apnea (adult) (pediatric): Secondary | ICD-10-CM | POA: Diagnosis not present

## 2018-08-23 NOTE — Telephone Encounter (Signed)
Dr. Wynona Neatlalere has reviewed the home sleep study this showed moderately severe sleep apnea. Recommendations are to start auto triturating cpap at 5-15 . Follow up in 31 to 90 days of cpap start.  Patient is aware and verbalized  Understanding. Order has been placed.

## 2018-08-24 NOTE — Telephone Encounter (Signed)
Go ahead and tell pt that test was positive and set up cpap just like DR Wynona Neatlalere suggests with f/u with him in 6 weeks   thx

## 2018-09-07 ENCOUNTER — Telehealth: Payer: Self-pay | Admitting: Internal Medicine

## 2018-09-07 NOTE — Telephone Encounter (Signed)
Called and spoke with pt regarding cpap machine update [facility] Advised pt that I will contact [facility] today for update.  Call[facility] and spoke with [facility] at [phone] ext [phone] Amy advised that she tried calling the pt, no answer on home phone Provided Amy with pt's mobile phone She was calling pt today on alt phone number  Routing message to [place] for [employer]

## 2018-09-11 NOTE — Telephone Encounter (Signed)
Spoke with patient, patient [facility] and nothing further is needed.

## 2018-09-15 ENCOUNTER — Telehealth: Payer: Self-pay | Admitting: Internal Medicine

## 2018-09-15 DIAGNOSIS — G4733 Obstructive sleep apnea (adult) (pediatric): Secondary | ICD-10-CM

## 2018-09-15 DIAGNOSIS — E6609 Other obesity due to excess calories: Secondary | ICD-10-CM | POA: Diagnosis not present

## 2018-09-15 DIAGNOSIS — I10 Essential (primary) hypertension: Secondary | ICD-10-CM | POA: Diagnosis not present

## 2018-09-15 DIAGNOSIS — E782 Mixed hyperlipidemia: Secondary | ICD-10-CM | POA: Diagnosis not present

## 2018-09-15 NOTE — Telephone Encounter (Signed)
Fine with me to do written rx Be sure has/ keeps should has/keeps appt with Olalere as I don't see sleep patients on a regular basis and is hard for me to troubleshoot problems like the one he is now having to navigate the best/ cheapest approach

## 2018-09-15 NOTE — Telephone Encounter (Signed)
Routing to doctor Wynona NeatOlalere to advise on this situation.

## 2018-09-15 NOTE — Telephone Encounter (Signed)
Pt states [facility] is charging him too much and was able to find machine online much less. MW please advise on giving printed Rx for CPAP.   Pt is aware that MW is out of the office this afternoon and will advise on Monday.

## 2018-09-18 ENCOUNTER — Other Ambulatory Visit: Payer: Self-pay

## 2018-09-18 DIAGNOSIS — G4733 Obstructive sleep apnea (adult) (pediatric): Secondary | ICD-10-CM

## 2018-09-18 NOTE — Telephone Encounter (Addendum)
Order done with assistance from [employer] Called spoke with patient, informed him that order is completed and printed Rx placed up front in brown accordion folder Patient is aware.  Nothing further needed at this time; will sign off.

## 2018-09-18 NOTE — Telephone Encounter (Signed)
Yes  it is okay to give printed prescription for CPAP

## 2018-09-20 DIAGNOSIS — L723 Sebaceous cyst: Secondary | ICD-10-CM | POA: Diagnosis not present

## 2018-09-21 ENCOUNTER — Emergency Department (HOSPITAL_COMMUNITY): Payer: BLUE CROSS/BLUE SHIELD

## 2018-09-21 ENCOUNTER — Encounter (HOSPITAL_COMMUNITY): Payer: Self-pay | Admitting: Emergency Medicine

## 2018-09-21 ENCOUNTER — Emergency Department (HOSPITAL_COMMUNITY)
Admission: EM | Admit: 2018-09-21 | Discharge: 2018-09-21 | Disposition: A | Discharge status: Home / Self Care | Payer: BLUE CROSS/BLUE SHIELD | Attending: Emergency Medicine | Admitting: Emergency Medicine

## 2018-09-21 DIAGNOSIS — E785 Hyperlipidemia, unspecified: Secondary | ICD-10-CM | POA: Insufficient documentation

## 2018-09-21 DIAGNOSIS — W11XXXA Fall on and from ladder, initial encounter: Secondary | ICD-10-CM | POA: Insufficient documentation

## 2018-09-21 DIAGNOSIS — R079 Chest pain, unspecified: Secondary | ICD-10-CM | POA: Diagnosis not present

## 2018-09-21 DIAGNOSIS — R1084 Generalized abdominal pain: Secondary | ICD-10-CM | POA: Diagnosis not present

## 2018-09-21 DIAGNOSIS — W19XXXA Unspecified fall, initial encounter: Secondary | ICD-10-CM

## 2018-09-21 DIAGNOSIS — R0781 Pleurodynia: Secondary | ICD-10-CM | POA: Diagnosis not present

## 2018-09-21 DIAGNOSIS — S2241XA Multiple fractures of ribs, right side, initial encounter for closed fracture: Secondary | ICD-10-CM | POA: Diagnosis not present

## 2018-09-21 DIAGNOSIS — I10 Essential (primary) hypertension: Secondary | ICD-10-CM | POA: Insufficient documentation

## 2018-09-21 DIAGNOSIS — R0689 Other abnormalities of breathing: Secondary | ICD-10-CM | POA: Diagnosis not present

## 2018-09-21 DIAGNOSIS — R52 Pain, unspecified: Secondary | ICD-10-CM | POA: Diagnosis not present

## 2018-09-21 DIAGNOSIS — Y990 Civilian activity done for income or pay: Secondary | ICD-10-CM | POA: Diagnosis not present

## 2018-09-21 DIAGNOSIS — Z87891 Personal history of nicotine dependence: Secondary | ICD-10-CM | POA: Insufficient documentation

## 2018-09-21 DIAGNOSIS — Y929 Unspecified place or not applicable: Secondary | ICD-10-CM | POA: Diagnosis not present

## 2018-09-21 DIAGNOSIS — Y9389 Activity, other specified: Secondary | ICD-10-CM | POA: Insufficient documentation

## 2018-09-21 DIAGNOSIS — S299XXA Unspecified injury of thorax, initial encounter: Secondary | ICD-10-CM | POA: Diagnosis not present

## 2018-09-21 LAB — COMPREHENSIVE METABOLIC PANEL
ALK PHOS: 41 U/L (ref 38–126)
ALT: 27 U/L (ref 0–44)
ANION GAP: 11 (ref 5–15)
AST: 23 U/L (ref 15–41)
Albumin: 4.1 g/dL (ref 3.5–5.0)
BILIRUBIN TOTAL: 1 mg/dL (ref 0.3–1.2)
BUN: 13 mg/dL (ref 6–20)
CO2: 26 mmol/L (ref 22–32)
Calcium: 9.3 mg/dL (ref 8.9–10.3)
Chloride: 103 mmol/L (ref 98–111)
Creatinine, Ser: 1.14 mg/dL (ref 0.61–1.24)
GFR calc Af Amer: >60 mL/min (ref >60)
GFR calc non Af Amer: >60 mL/min (ref >60)
GLUCOSE: 101 mg/dL — AB (ref 70–99)
Potassium: 3.7 mmol/L (ref 3.5–5.1)
Sodium: 140 mmol/L (ref 135–145)
Total Protein: 6.4 g/dL — ABNORMAL LOW (ref 6.5–8.1)

## 2018-09-21 LAB — CBC
HEMATOCRIT: 40.9 % (ref 39.0–52.0)
HEMOGLOBIN: 13.7 g/dL (ref 13.0–17.0)
MCH: 30.9 pg (ref 26.0–34.0)
MCHC: 33.5 g/dL (ref 30.0–36.0)
MCV: 92.3 fL (ref 78.0–100.0)
Platelets: 228 10*3/uL (ref 150–400)
RBC: 4.43 MIL/uL (ref 4.22–5.81)
RDW: 12.1 % (ref 11.5–15.5)
WBC: 10.3 10*3/uL (ref 4.0–10.5)

## 2018-09-21 LAB — I-STAT TROPONIN, ED: Troponin i, poc: 0 ng/mL (ref 0.00–0.08)

## 2018-09-21 LAB — CK: Total CK: 145 U/L (ref 49–397)

## 2018-09-21 MED ORDER — FENTANYL CITRATE (PF) 100 MCG/2ML IJ SOLN
50.0000 ug | Freq: Once | INTRAMUSCULAR | Status: AC
  Administered 2018-09-21: 50 ug via INTRAVENOUS
  Filled 2018-09-21: qty 2

## 2018-09-21 MED ORDER — HYDROMORPHONE HCL 2 MG PO TABS
2.0000 mg | ORAL_TABLET | Freq: Once | ORAL | Status: AC
  Administered 2018-09-21: 2 mg via ORAL
  Filled 2018-09-21: qty 1

## 2018-09-21 MED ORDER — IOHEXOL 300 MG/ML  SOLN
100.0000 mL | Freq: Once | INTRAMUSCULAR | Status: AC
  Administered 2018-09-21: 100 mL via INTRAVENOUS

## 2018-09-21 MED ORDER — LIDOCAINE 5 % EX PTCH: 1.0000 | MEDICATED_PATCH | CUTANEOUS | 0 refills | Status: DC

## 2018-09-21 MED ORDER — HYDROMORPHONE HCL 2 MG PO TABS: 2.0000 mg | ORAL_TABLET | Freq: Four times a day (QID) | ORAL | 0 refills | Status: DC | PRN

## 2018-09-21 NOTE — ED Triage Notes (Signed)
Patient arrived via EMS related to fall from a 60ft ladder, he reports that he was on a 10ft ladder at work, he was reaching for a pen when "I got shocked." He states "I don't know if I passed out but I remember getting shocked and then hitting my side on light fixture and metal shelve." EMS gave patient fentanyl.

## 2018-09-21 NOTE — Discharge Instructions (Addendum)
You were evaluated in the Emergency Department and after careful evaluation, we did not find any emergent condition requiring admission or further testing in the hospital.  Your symptoms today seem to be due to rib fractures.  Please use the medications provided as needed and use the incentive spirometer device to take deep breaths at home.  Please return to the Emergency Department if you experience any worsening of your condition.  We encourage you to follow up with a primary care provider.  Thank you for allowing [place] to be a part of your care.

## 2018-09-21 NOTE — ED Notes (Signed)
D/c reviewed with patient and spouse 

## 2018-09-21 NOTE — ED Provider Notes (Signed)
[facility] Emergency Department Provider Note MRN:  [id number]  Arrival date & time: 09/21/18     Chief Complaint   Fall   History of Present Illness   Brandon King is a 57 y.o. year-old male with a history of hypertension presenting to the ED with chief complaint of fall.  Patient was on a ladder working on a roof, estimated 6 to 12 feet in the air.  Was working next to some fluorescent lighting, which he thinks shorted and shocked him.  He believes that the shock forced him to lose consciousness, causing him to fall to the ground.  His right flank landed on some metal material, causing him to regain consciousness and experienced severe pain.  Denies head trauma or loss of consciousness, no neck pain, no blood thinners, pain is constant, worse with motion.  Review of Systems  A complete 10 system review of systems was obtained and all systems are negative except as noted in the HPI and PMH.   Patient's Health History    Past Medical History:  Diagnosis Date  . Arthritis   . Hyperlipidemia   . Hypertension   . Kidney stones    "never had OR" (04/09/2014)  . Osteoarthritis of left knee 04/09/2014  . Pneumonia    hx of    Past Surgical History:  Procedure Laterality Date  . APPENDECTOMY  2007  . CARDIAC CATHETERIZATION  ~ 2003  . FOOT NEUROMA SURGERY    . KNEE ARTHROSCOPY Left 04/09/2014   Procedure: ARTHROSCOPY KNEE;  Surgeon: Eulas Post, MD;  Location: [facility] OR;  Service: Orthopedics;  Laterality: Left;  . NASAL SEPTUM SURGERY  1981  . PARTIAL KNEE ARTHROPLASTY Left 04/09/2014   Procedure: UNICOMPARTMENTAL KNEE;  Surgeon: Eulas Post, MD;  Location: [facility] OR;  Service: Orthopedics;  Laterality: Left;  . REPLACEMENT UNICONDYLAR JOINT KNEE Left 04/09/2014  . TONSILLECTOMY AND ADENOIDECTOMY  1972    Family History  Problem Relation Age of Onset  . Heart disease Mother   . Heart disease Father   . Heart disease Brother   . Heart disease Paternal  Grandfather   . Colon cancer Neg Hx   . Esophageal cancer Neg Hx   . Rectal cancer Neg Hx   . Stomach cancer Neg Hx     Social History   Socioeconomic History  . Marital status: Married    Spouse name: Not on file  . Number of children: Not on file  . Years of education: Not on file  . Highest education level: Not on file  Occupational History  . Not on file  Social Needs  . Financial resource strain: Not on file  . Food insecurity:    Worry: Not on file    Inability: Not on file  . Transportation needs:    Medical: Not on file    Non-medical: Not on file  Tobacco Use  . Smoking status: Former Smoker    Packs/day: 0.50    Years: 32.00    Pack years: 16.00    Types: Cigarettes  . Smokeless tobacco: Never Used  . Tobacco comment: 04/09/2014 "quit smoking ~ 2008"  Substance and Sexual Activity  . Alcohol use: Yes    Alcohol/week: 3.0 standard drinks    Types: 3 Cans of beer per week  . Drug use: No  . Sexual activity: Yes  Lifestyle  . Physical activity:    Days per week: Not on file    Minutes per session:  Not on file  . Stress: Not on file  Relationships  . Social connections:    Talks on phone: Not on file    Gets together: Not on file    Attends religious service: Not on file    Active member of club or organization: Not on file    Attends meetings of clubs or organizations: Not on file    Relationship status: Not on file  . Intimate partner violence:    Fear of current or ex partner: Not on file    Emotionally abused: Not on file    Physically abused: Not on file    Forced sexual activity: Not on file  Other Topics Concern  . Not on file  Social History Narrative  . Not on file     Physical Exam  Vital Signs and Nursing Notes reviewed Vitals:   09/21/18 1330 09/21/18 1345  BP: (!) 145/68 138/80  Pulse: 60 60  Resp: (!) 9 11  Temp:    SpO2: 98% 97%    CONSTITUTIONAL: Well-appearing, NAD NEURO:  Alert and oriented x 3, no focal deficits EYES:   eyes equal and reactive ENT/NECK:  no LAD, no JVD CARDIO: Regular rate, well-perfused, normal S1 and S2 PULM:  CTAB no wheezing or rhonchi GI/GU:  normal bowel sounds, non-distended, non-tender MSK/SPINE:  No gross deformities, no edema SKIN:  no rash, mild bruising to right flank with associated tenderness palpation PSYCH:  Appropriate speech and behavior  Diagnostic and Interventional Summary    EKG Interpretation  Date/Time:  Thursday September 21 2018 10:56:44 EDT Ventricular Rate:  65 PR Interval:    QRS Duration: 104 QT Interval:  392 QTC Calculation: 408 R Axis:   44 Text Interpretation:  Sinus rhythm Confirmed by Kennis Carina [phone]) on 09/21/2018 2:50:06 PM      Labs Reviewed  CBC  COMPREHENSIVE METABOLIC PANEL  CK  I-STAT TROPONIN, ED    CT ABDOMEN PELVIS W CONTRAST  Final Result    DG Chest 2 View  Final Result      Medications  fentaNYL (SUBLIMAZE) injection 50 mcg (has no administration in time range)  HYDROmorphone (DILAUDID) tablet 2 mg (has no administration in time range)  fentaNYL (SUBLIMAZE) injection 50 mcg (50 mcg Intravenous Given 09/21/18 1149)  iohexol (OMNIPAQUE) 300 MG/ML solution 100 mL (100 mLs Intravenous Contrast Given 09/21/18 1359)     Procedures Critical Care  ED Course and Medical Decision Making  I have reviewed the triage vital signs and the nursing notes.  Pertinent labs & imaging results that were available during my care of the patient were reviewed by me and considered in my medical decision making (see below for details).  Cannot exclude renal injury in this 57 year old male with fall from 6 to 12 feet, bruising to the right flank, pain with tenderness to palpation to the ribs.  No significant central tenderness or shortness of breath, otherwise looks well with stable vitals.  Little to no concern for significant intrathoracic injuries with the exception of possible rib fractures.  No evidence of burns to the skin on exam.   Will monitor given the concern for possible electric shock, will obtain CT of the abdomen, and x-ray of the chest.  CT reveals no evidence of solid organ injury.  Mildly displaced fractures of ribs 11 and 12 on the right.  Discussed case with Dr. Cliffton Asters of trauma surgery.  Patient has had no ectopy, no arrhythmia during his multiple hours here in the emergency  department.  Therefore, no indication for admission given the electrocution.  Will provide with incentive spirometer, prescription for pain control and dc pending labs.  Signed out to Dr. Effie Shy at shift change pending CMP and CK results  Elmer Sow. Pilar Plate, MD [facility] Emergency Medicine [facility] [email or web address]  Final Clinical Impressions(s) / ED Diagnoses     ICD-10-CM   1. Closed fracture of multiple ribs of right side, initial encounter S22.41XA   2. Fall W19.Gean Birchwood Chest 2 View    DG Chest 2 View    ED Discharge Orders    None         Sabas Sous, MD 09/21/18 [phone]

## 2018-09-21 NOTE — ED Provider Notes (Signed)
Gout from Dr. Sedonia Small to evaluate labs, c-Met and CK, they have returned and are normal.  Patient informed.  He works as a Development worker, community, self-employed and does not need a work note.  He was advised to avoid driving or working or climb ladders while using narcotics.  Wife will come and get him and take him home.   Daleen Bo, MD 09/21/18 [phone]

## 2018-09-22 ENCOUNTER — Encounter: Payer: Self-pay | Admitting: Primary Care

## 2018-09-22 ENCOUNTER — Ambulatory Visit (INDEPENDENT_AMBULATORY_CARE_PROVIDER_SITE_OTHER): Payer: BLUE CROSS/BLUE SHIELD | Admitting: Primary Care

## 2018-09-22 ENCOUNTER — Telehealth: Payer: Self-pay | Admitting: Internal Medicine

## 2018-09-22 DIAGNOSIS — S2239XA Fracture of one rib, unspecified side, initial encounter for closed fracture: Secondary | ICD-10-CM | POA: Insufficient documentation

## 2018-09-22 DIAGNOSIS — S2241XA Multiple fractures of ribs, right side, initial encounter for closed fracture: Secondary | ICD-10-CM

## 2018-09-22 DIAGNOSIS — S2249XA Multiple fractures of ribs, unspecified side, initial encounter for closed fracture: Secondary | ICD-10-CM | POA: Insufficient documentation

## 2018-09-22 MED ORDER — IBUPROFEN 800 MG PO TABS: 800.0000 mg | ORAL_TABLET | Freq: Three times a day (TID) | ORAL | 0 refills | Status: DC

## 2018-09-22 MED ORDER — TRAMADOL HCL 50 MG PO TABS: 50.0000 mg | ORAL_TABLET | Freq: Four times a day (QID) | ORAL | 0 refills | Status: AC | PRN

## 2018-09-22 NOTE — Patient Instructions (Addendum)
  BP 120/78 today  Mortin 800mg  TID for pain, make sure to take with food   Tramadol 1 tab q 6 hours for pain (do not combine with other pain medication)  Use IS 3-4 times a day  Follow up on Monday with primary care for pain management   New CPAP, needs follow up wth Dr. Wynona Neat for OSA in 4-6 weeks (new patient for him)

## 2018-09-22 NOTE — Progress Notes (Signed)
@Patient  ID: Brandon King, male    DOB: [birth year], 59 y.o.   MRN: [id number]  Chief Complaint  Patient presents with  . Acute Visit    right sided rib pain    Referring provider: Barbie Banner, MD  HPI: 57 year old male, former smoker. PMH hypertension, obstructive sleep apnea. Patient of Dr. Sherene Sires, initial [facility] care consult on 07/05/18 for dyspnea and dizziness. Referred to Dr. Wynona Neat for management of sleep apnea. ACEi was discontinued and he was started on Diovan 160mg  for blood pressure. PCP is Dr. Andrey Campanile, he has not contact his office or set up follow-up apt.  08/14/2018 Presents today for acute visit complaining of bilateral leg swelling, worse in the evening. No changes to diet or activity. New to valsartan, states that his pharmacist told him to speak with provider about medication. He traveled to [place] 2 weeks ago. Denies sob, cough, redness or warmth. He has not been checking BP at home or bought a new machine.   09/22/2018 Patient presents today for acute visit with complaints of right side rib pain. He is accompanied by his wife. Patient was seen in ED on 09/26 s/p fall from ladder. CT reveals no evidence of solid organ injury. Mildly displaced fractures of ribs 11 and 12 on the right. He was given dilaudid 2mg  for pain management (#12 tabs/3 days supply). He is not out of medication but states that he doesn't have enough to get him through the weekend. Rates his pain 8/10 with breathing or movement. Ok at rest. States that he called his primary care but that they were off today and all weekend d/t remodeling/construction. Taken tramadol before with no issue. Breathing is ok, not short of breath. Using incentive spirometry but reports that it's hard to take a deep breath. Patient has moderate-severe OSA but doesn't have cpap. Hasn't ordered online as of yet.          Allergies  Allergen Reactions  . Bee Venom Anaphylaxis  . Percocet  [Oxycodone-Acetaminophen] Itching  . Sulfa Antibiotics Other (See Comments)    Mouth blisters    Immunization History  Administered Date(s) Administered  . Tdap 04/06/2012    Past Medical History:  Diagnosis Date  . Arthritis   . Hyperlipidemia   . Hypertension   . Kidney stones    "never had OR" (04/09/2014)  . Osteoarthritis of left knee 04/09/2014  . Pneumonia    hx of    Tobacco History: Social History   Tobacco Use  Smoking Status Former Smoker  . Packs/day: 0.50  . Years: 32.00  . Pack years: 16.00  . Types: Cigarettes  Smokeless Tobacco Never Used  Tobacco Comment   04/09/2014 "quit smoking ~ 2008"   Counseling given: Not Answered Comment: 04/09/2014 "quit smoking ~ 2008"   Outpatient Medications Prior to Visit  Medication Sig Dispense Refill  . carvedilol (COREG) 12.5 MG tablet Take 12.5 mg by mouth 2 (two) times daily.  3  . diphenhydrAMINE (BENADRYL) 25 MG tablet Take 2 tablets (50 mg total) by mouth every 6 (six) hours. Take 1-2 tablets every 6 hours x 2 days, then space out to an as needed basis (Patient not taking: Reported on 09/21/2018) 20 tablet 0  . fluticasone (FLONASE) 50 MCG/ACT nasal spray Place 2 sprays into both nostrils daily as needed for allergies.    . hydrochlorothiazide (HYDRODIURIL) 25 MG tablet Take 1 tablet by mouth Daily.    Marland Kitchen HYDROmorphone (DILAUDID) 2 MG tablet Take  1 tablet (2 mg total) by mouth every 6 (six) hours as needed for severe pain. 12 tablet 0  . lidocaine (LIDODERM) 5 % Place 1 patch onto the skin daily. Remove & Discard patch within 12 hours or as directed by MD 5 patch 0  . LIPITOR 40 MG tablet Take 20 mg by mouth every other day.     . meloxicam (MOBIC) 15 MG tablet Take 15 mg by mouth daily.    . Multiple Vitamins-Minerals (CENTRUM SILVER 50+MEN) TABS Take 1 tablet by mouth daily.    . valsartan (DIOVAN) 160 MG tablet Take 1 tablet (160 mg total) by mouth daily. 30 tablet 11  . furosemide (LASIX) 20 MG tablet Take 1 tab  as needed for leg swelling (Patient not taking: Reported on 09/21/2018) 30 tablet 0   No facility-administered medications prior to visit.     Review of Systems  Review of Systems  Constitutional: Negative.   Respiratory: Negative for cough, chest tightness, shortness of breath, wheezing and stridor.   Cardiovascular: Negative.   Musculoskeletal:       Right rib pain     Physical Exam  BP 120/78   Pulse 67   Ht 5\' 10"  (1.778 m)   Wt 221 lb 6.4 oz (100.4 kg)   SpO2 95%   BMI 31.77 kg/m  Physical Exam  Constitutional: He is oriented to person, place, and time. He appears well-developed and well-nourished. No distress.  HENT:  Head: Normocephalic and atraumatic.  Eyes: Pupils are equal, round, and reactive to light. EOM are normal.  Neck: Normal range of motion. Neck supple.  Pulmonary/Chest: Effort normal and breath sounds normal. No stridor. No respiratory distress. He has no wheezes.  O2 sat 95% RA, RR 16-18 No resp distress  Musculoskeletal: Normal range of motion.  Guarding right rib cage   Neurological: He is alert and oriented to person, place, and time.  Skin: Skin is warm and dry.  Psychiatric: Judgment and thought content normal.     Lab Results:  CBC    Component Value Date/Time   WBC 10.3 09/21/2018 1450   RBC 4.43 09/21/2018 1450   HGB 13.7 09/21/2018 1450   HCT 40.9 09/21/2018 1450   PLT 228 09/21/2018 1450   MCV 92.3 09/21/2018 1450   MCH 30.9 09/21/2018 1450   MCHC 33.5 09/21/2018 1450   RDW 12.1 09/21/2018 1450   LYMPHSABS 1.7 11/29/2014 1244   MONOABS 0.5 11/29/2014 1244   EOSABS 0.9 (H) 11/29/2014 1244   BASOSABS 0.0 11/29/2014 1244    BMET    Component Value Date/Time   NA 140 09/21/2018 1450   K 3.7 09/21/2018 1450   CL 103 09/21/2018 1450   CO2 26 09/21/2018 1450   GLUCOSE 101 (H) 09/21/2018 1450   BUN 13 09/21/2018 1450   CREATININE 1.14 09/21/2018 1450   CALCIUM 9.3 09/21/2018 1450   GFRNONAA >60 09/21/2018 1450   GFRAA >60  09/21/2018 1450    BNP No results found for: BNP  ProBNP    Component Value Date/Time   PROBNP 35.0 08/14/2018 1551    Imaging: Dg Chest 2 View  Result Date: 09/21/2018 CLINICAL DATA:  Right lateral chest pain EXAM: CHEST - 2 VIEW COMPARISON:  06/27/2018 FINDINGS: Low lung volumes. Heart and mediastinal contours are within normal limits. No focal opacities or effusions. No acute bony abnormality. No visible rib fracture or pneumothorax. IMPRESSION: No active cardiopulmonary disease. Electronically Signed   By: Charlett Nose M.D.  On: 09/21/2018 12:46   Ct Abdomen Pelvis W Contrast  Result Date: 09/21/2018 CLINICAL DATA:  Patient was shocked while working on a ladder and losing consciousness. Patient fell from a ladder landing on the right. Severe right-sided abdominal pain. EXAM: CT ABDOMEN AND PELVIS WITH CONTRAST TECHNIQUE: Multidetector CT imaging of the abdomen and pelvis was performed using the standard protocol following bolus administration of intravenous contrast. CONTRAST:  OMNIPAQUE IOHEXOL 300 MG/ML  SOLN COMPARISON:  None. FINDINGS: Lower chest: Top normal heart size without pericardial effusion. Scattered three-vessel coronary arteriosclerosis is noted. There is bibasilar atelectasis without pulmonary contusion or pneumothorax. Trace pleural fluid at the lung bases. Hepatobiliary: Small cyst or hemangioma in the left hepatic lobe measuring 1 cm. No liver laceration. Gallbladder is unremarkable. Pancreas: Unremarkable. No pancreatic ductal dilatation or surrounding inflammatory changes. Spleen: No splenic injury or perisplenic hematoma. Adrenals/Urinary Tract: Normal bilateral adrenal glands and kidneys apart from a 1.3 cm interpolar simple cyst the left. Bladder is unremarkable without focal mural thickening, rupture or calculus. Stomach/Bowel: Decompressed stomach with normal small bowel rotation. Status post appendectomy. The colon is unremarkable. No mural thickening,  abnormal dilatation, inflammation or obstruction. Vascular/Lymphatic: Nonaneurysmal atherosclerotic aorta. Patent atherosclerotic branch vessels. No lymphadenopathy. Reproductive: Prostate and seminal vesicles are unremarkable. Other: Fat containing inguinal hernias bilaterally. No free air nor free fluid. Musculoskeletal: Acute minimally displaced fractures of the posterior right eleventh and twelfth ribs near the costovertebral junctures. IMPRESSION: 1. Acute right posterior eleventh and twelfth ribs near the costovertebral juncture is with minimal displacement. No associated hemothorax or pneumothorax. No pulmonary contusion. 2. No acute solid nor hollow visceral organ injury. 3. Small cyst of the left kidney measuring 1.3 cm maximum. 4. Circumscribed hypodensity statistically a cyst or hemangioma in the left hepatic lobe measuring up to 1 cm. 5. Coronary arteriosclerosis and aortic atherosclerosis. Electronically Signed   By: Tollie Eth M.D.   On: 09/21/2018 14:28     Assessment & Plan:   Pleasant 57 year old gentleman. Patient presents today for pain management regarding recent rib fracture. PMP awareness tool accessed, no unexpected prescriptions found. Received oral Dilaudid 2mg  #12 tabs from ED on 09/26. Patient states he does not have enough medication to get him through the weekend. He was unable to see PCP today. He appears to have moderate pain, rating 8/10 on pain scale. VSS, BP 120/78. O2 sat 95% RA, normal respirations. Patient is at a higher risk for respiratory depression d/t rib fracture and untreated OSA, discussed these concerns with patient. I do not recommend continuing dilaudid for pain management. He can take motrin 800mg  TID and prn tramadol every 8 hours for severe pain (not to be combined with other pain medication). He needs to follow up with primary care on Monday.   Rib fractures - Rating right rib pain 8/10 with deep breath or movement. Ok at rest - Denies shortness of  breath or trouble breathing - Recommend ibuprofen 800mg  TID with food, prn tramadol 50mg  q6hrs for pain - OK to brace right side with soft pillow/banket while taking deep breath or coughing  - Ice as needed  - Continues IS use, important to take deep breaths  - Follow up with PCP on Monday      Glenford Bayley, NP 09/22/2018

## 2018-09-22 NOTE — Assessment & Plan Note (Addendum)
-   Rating right rib pain 8/10 with deep breath or movement. Ok at rest - Denies shortness of breath or trouble breathing - Recommend ibuprofen 800mg  TID with food, prn tramadol 50mg  q6hrs for pain - OK to brace right side with soft pillow/banket while taking deep breath or coughing  - Ice as needed  - Continues IS use, important to take deep breaths  - Follow up with PCP on Monday

## 2018-09-22 NOTE — Telephone Encounter (Signed)
Spoke with pt. Advised him that he would need an appointment to be evaluated before any medication would be given. Pt has been scheduled to see Beth this afternoon at 4:30pm. Nothing further was needed.

## 2018-09-24 NOTE — Progress Notes (Signed)
Chart and office note reviewed in detail  > agree with a/p as outlined    

## 2018-09-29 ENCOUNTER — Other Ambulatory Visit: Payer: Self-pay | Admitting: Primary Care

## 2018-09-29 MED ORDER — FUROSEMIDE 20 MG PO TABS: 20.0000 mg | ORAL_TABLET | Freq: Every day | ORAL | 1 refills | Status: DC | PRN

## 2018-09-29 NOTE — Progress Notes (Signed)
Received Rx request for Furosemide 20mg  #30 one daily prn for leg swelling. Per Beth okay to refill #30 x1 refill.  Rx for Furosemide 20mg  has been sent to preferred pharmacy.  Last OV 09/22/18 with Beth with pending OV for 10/10/18. Nothing further is needed.

## 2018-10-10 ENCOUNTER — Ambulatory Visit: Payer: BLUE CROSS/BLUE SHIELD | Admitting: Internal Medicine

## 2018-11-06 ENCOUNTER — Ambulatory Visit: Payer: BLUE CROSS/BLUE SHIELD | Admitting: Internal Medicine

## 2018-11-15 DIAGNOSIS — H1859 Other hereditary corneal dystrophies: Secondary | ICD-10-CM | POA: Diagnosis not present

## 2018-12-01 DIAGNOSIS — I10 Essential (primary) hypertension: Secondary | ICD-10-CM | POA: Diagnosis not present

## 2018-12-01 DIAGNOSIS — L506 Contact urticaria: Secondary | ICD-10-CM | POA: Diagnosis not present

## 2019-11-13 IMAGING — MR MR HEAD W/O CM
9 of 10 series · 34 of 48 positions shown · non-contrast
Comparison: Prior CT from 03/14/2004.

CLINICAL DATA: Initial evaluation for acute ataxia.

EXAM:
MRI HEAD WITHOUT CONTRAST
TECHNIQUE: Multiplanar, multiecho pulse sequences of the brain and surrounding
structures were obtained without intravenous contrast.

[Series 3: DWI · axial · 3.0mm · 0.94mm/px · z∈[-17,+133]mm · 8 of 101 slices shown (1 of 2)]
[im 1/101]
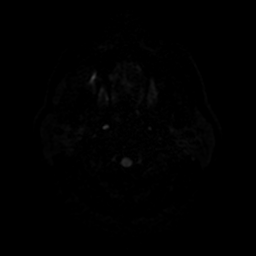
[im 12/101]
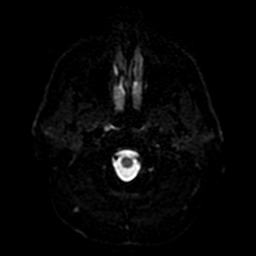
[im 34/101]
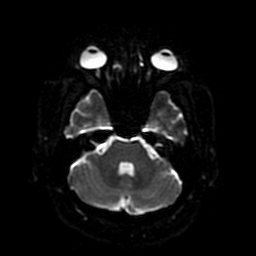
[im 45/101]
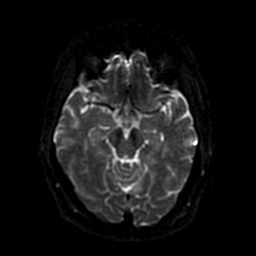
[im 56/101]
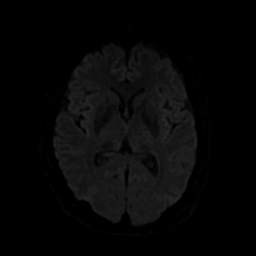
[im 67/101]
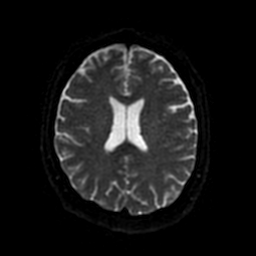
[im 89/101]
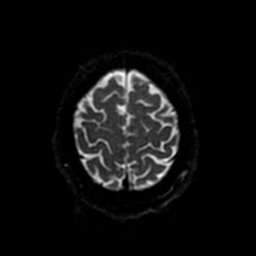
[im 101/101]
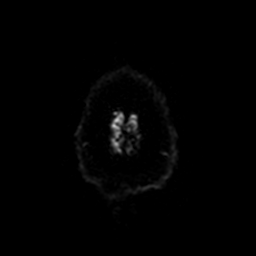

[Series 4: T2 · axial · 5.0mm · 0.47mm/px · z∈[-17,+133]mm · 2 of 26 slices shown (1 of 2)]
[im 1/26]
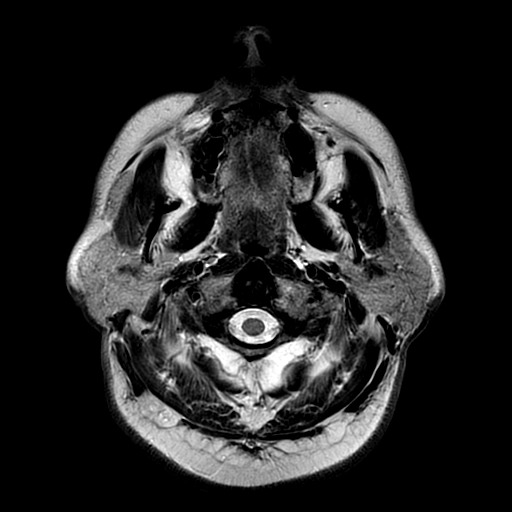
[im 26/26]
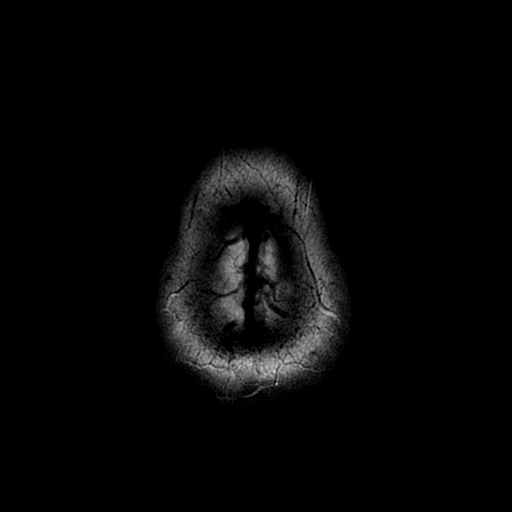

[Series 5: FLAIR · axial · 3.0mm · 0.47mm/px · z∈[-17,+133]mm · 2 of 26 slices shown (1 of 2)]
[im 1/26]
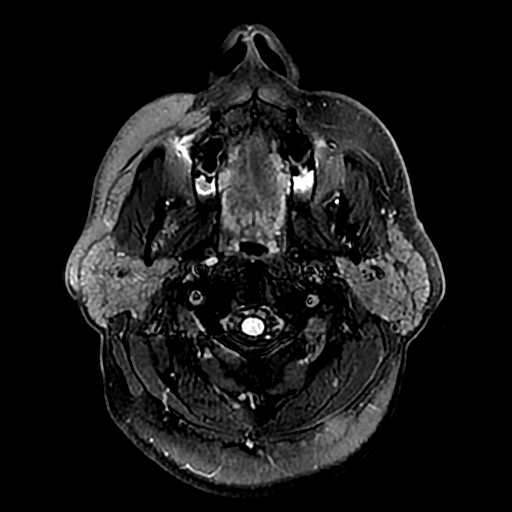
[im 26/26]
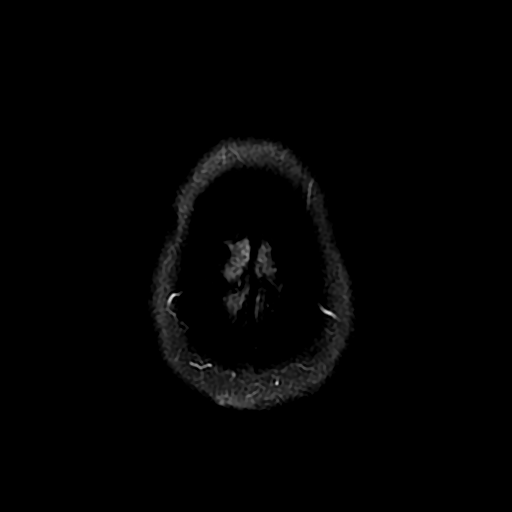

[Series 6: (person_name) · axial · 3.0mm · 0.47mm/px · z∈[-18,+32]mm · 3 of 104 slices shown]
[im 1/104]
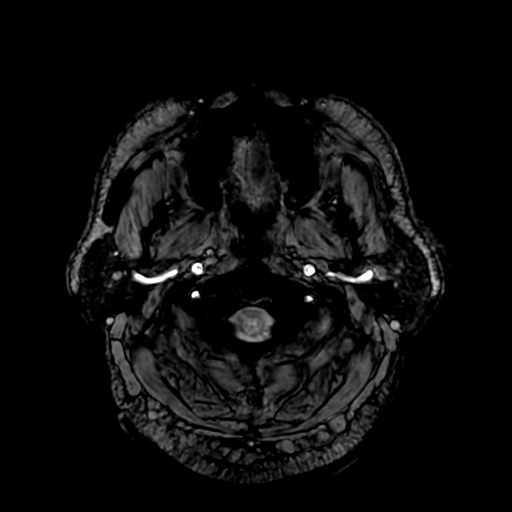
[im 12/104]
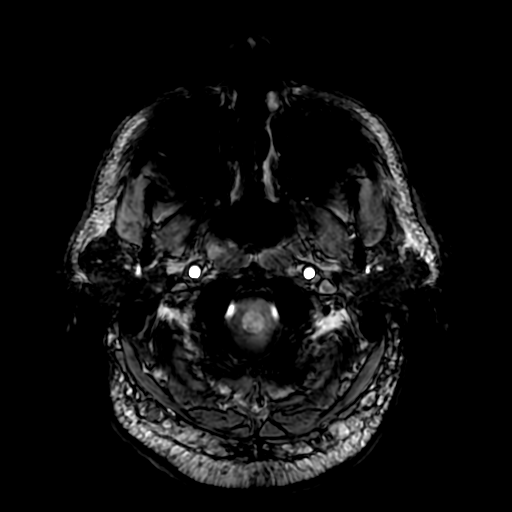
[im 35/104]
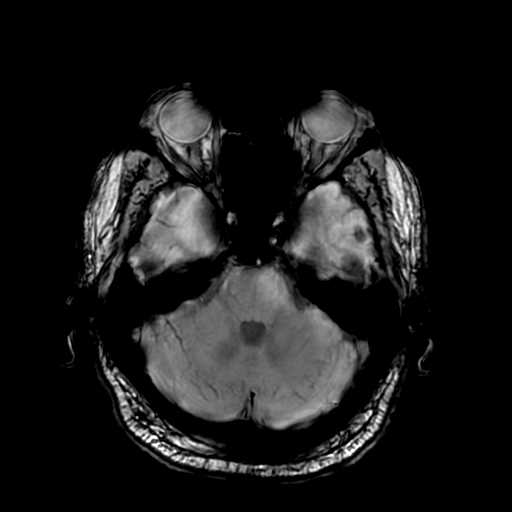

[Series 7: DWI · coronal · 4.0mm · 0.94mm/px · 6 of 68 slices shown (2 of 2)]
[im 1/68]
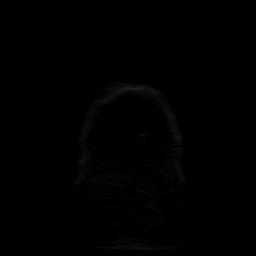
[im 14/68]
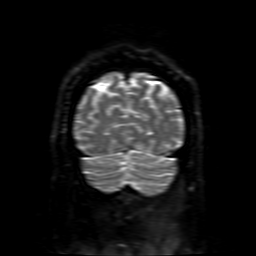
[im 27/68]
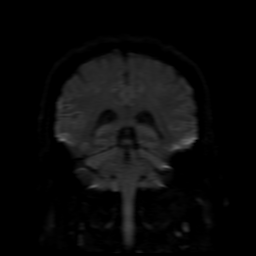
[im 41/68]
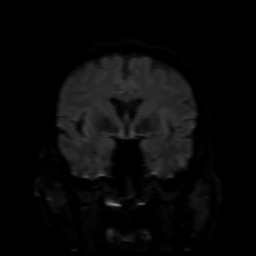
[im 54/68]
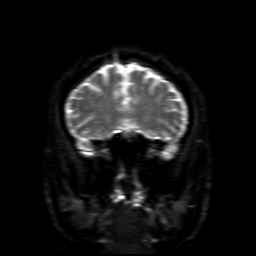
[im 68/68]
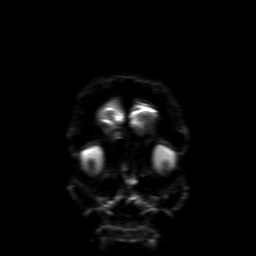

[Series 8: FLAIR · sagittal · 5.0mm · 0.47mm/px · 2 of 23 slices shown (2 of 2)]
[im 1/23]
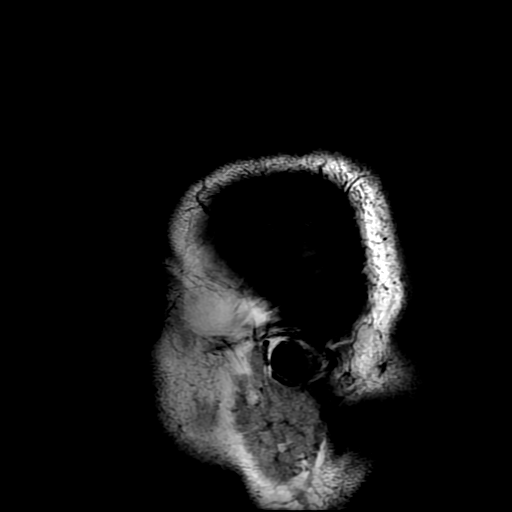
[im 23/23]
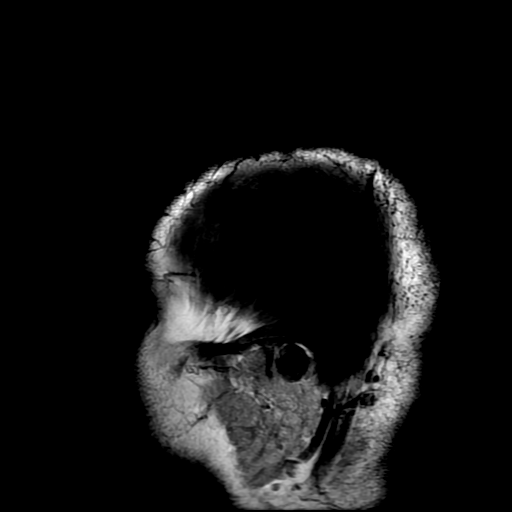

[Series 9: T2 · coronal · 5.0mm · 0.43mm/px · 3 of 28 slices shown (2 of 2)]
[im 1/28]
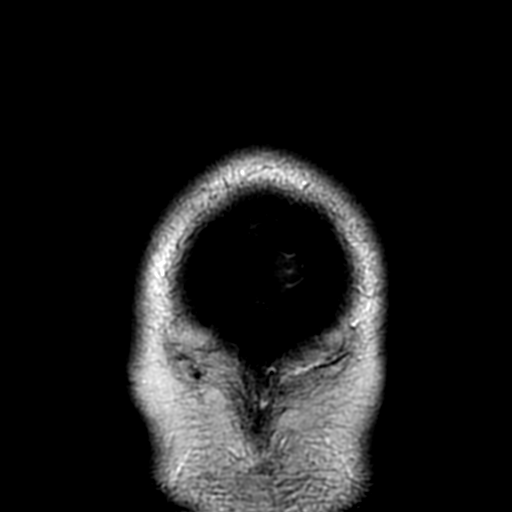
[im 14/28]
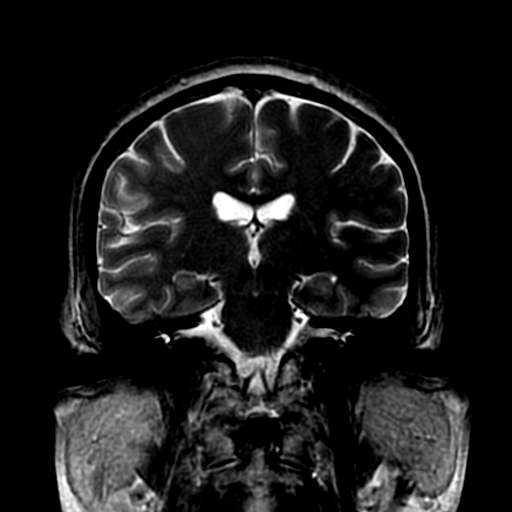
[im 28/28]
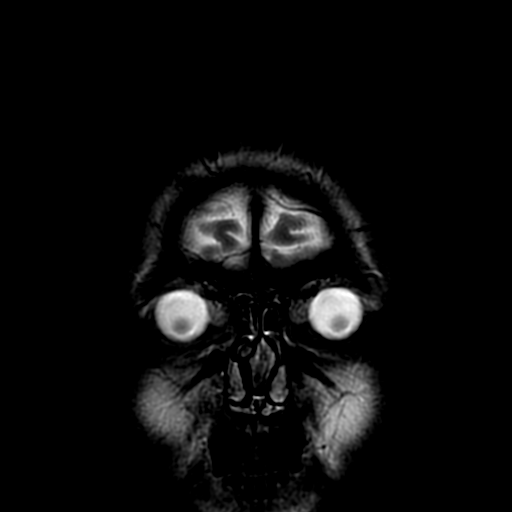

[Series 350: ADC · axial · 3.0mm · 0.94mm/px · z∈[-17,+133]mm · 5 of 50 slices shown (1 of 2)]
[im 1/50]
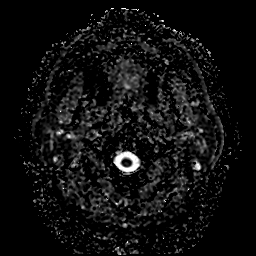
[im 13/50]
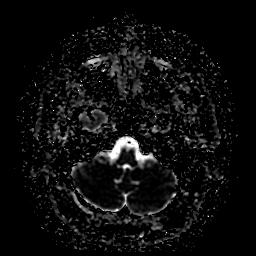
[im 25/50]
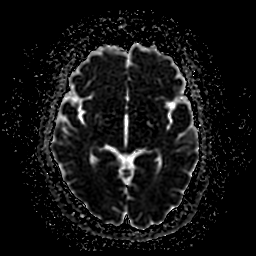
[im 37/50]
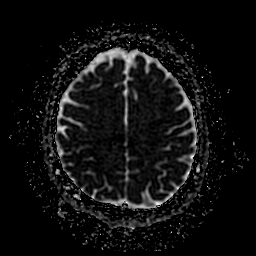
[im 50/50]
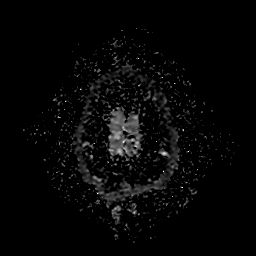

[Series 750: ADC · coronal · 4.0mm · 0.94mm/px · 3 of 34 slices shown (2 of 2)]
[im 1/34]
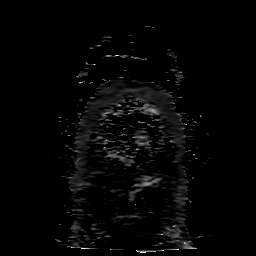
[im 17/34]
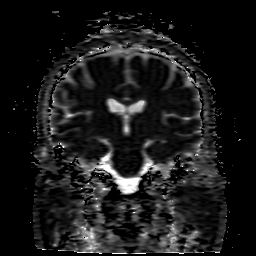
[im 34/34]
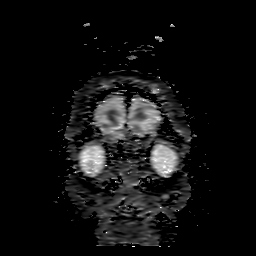

[34 of 48 positions shown; findings below may reference images not displayed]

FINDINGS: Brain: Cerebral volume within normal limits for age. Mild scattered
T2/FLAIR hyperintensities noted within the periventricular, deep,
and subcortical white matter both cerebral hemispheres, nonspecific.

No abnormal foci of restricted diffusion to suggest acute or
subacute ischemia. Gray-white matter differentiation maintained. No
evidence for chronic infarction. No acute or chronic intracranial
hemorrhage.

No mass lesion, midline shift or mass effect. No hydrocephalus. No
extra-axial fluid collection. Normal pituitary gland.

Vascular: Major intracranial vascular flow voids maintained.

Skull and upper cervical spine: Craniocervical junction normal. Bone
marrow signal intensity normal. No scalp soft tissue abnormality.

Sinuses/Orbits: Globes and orbital soft tissues within normal
limits. Scattered mucosal thickening throughout the ethmoidal air
cells. Paranasal sinuses are otherwise clear. No mastoid effusion.
Inner ear structures grossly normal.

Other: None.
IMPRESSION: 1. No acute intracranial abnormality.
2. Mild cerebral white matter changes, nonspecific, but most
commonly related to chronic small vessel ischemic disease. Primary
differential considerations include sequelae of complicated
migraine, prior infectious or inflammatory process, or vasculitis.
These are not typical for underlying demyelinating disease.

## 2020-03-17 ENCOUNTER — Ambulatory Visit: Payer: BLUE CROSS/BLUE SHIELD | Admitting: Family Medicine

## 2020-07-15 DIAGNOSIS — M5441 Lumbago with sciatica, right side: Secondary | ICD-10-CM | POA: Diagnosis not present

## 2020-07-15 DIAGNOSIS — M545 Low back pain: Secondary | ICD-10-CM | POA: Diagnosis not present

## 2020-07-15 DIAGNOSIS — M25551 Pain in right hip: Secondary | ICD-10-CM | POA: Diagnosis not present

## 2020-07-15 DIAGNOSIS — M7061 Trochanteric bursitis, right hip: Secondary | ICD-10-CM | POA: Diagnosis not present

## 2020-08-12 DIAGNOSIS — M7061 Trochanteric bursitis, right hip: Secondary | ICD-10-CM | POA: Diagnosis not present

## 2020-08-12 DIAGNOSIS — M25551 Pain in right hip: Secondary | ICD-10-CM | POA: Diagnosis not present

## 2021-01-12 ENCOUNTER — Ambulatory Visit
Admission: EM | Admit: 2021-01-12 | Discharge: 2021-01-12 | Disposition: A | Discharge status: Home / Self Care | Payer: BC Managed Care – PPO | Attending: Emergency Medicine | Admitting: Emergency Medicine

## 2021-01-12 ENCOUNTER — Encounter: Payer: Self-pay | Admitting: Emergency Medicine

## 2021-01-12 ENCOUNTER — Other Ambulatory Visit: Payer: Self-pay

## 2021-01-12 DIAGNOSIS — J069 Acute upper respiratory infection, unspecified: Secondary | ICD-10-CM

## 2021-01-12 MED ORDER — BENZONATATE 100 MG PO CAPS: 100.0000 mg | ORAL_CAPSULE | Freq: Three times a day (TID) | ORAL | 0 refills | Status: DC

## 2021-01-12 MED ORDER — PREDNISONE 10 MG (21) PO TBPK: ORAL_TABLET | Freq: Every day | ORAL | 0 refills | Status: DC

## 2021-01-12 NOTE — ED Triage Notes (Signed)
Pt reports he tested positive to Covid on 12/30/20 and was sick for three days. Now complains of headache x 2 days and SOB.

## 2021-01-12 NOTE — ED Provider Notes (Signed)
[facility]    CSN: [id number] Arrival date & time: 01/12/21  1759      History   Chief Complaint Chief Complaint  Patient presents with  . Headache  . Shortness of Breath    HPI [name] is a 60 y.o. male  History as below presenting for persistent COVID symptoms.  Was diagnosed 1/4.  Having worsening headaches for the last 2 days with subjective shortness of breath.  Denying tachypnea, tachycardia, chest pain or wheezing.  Does have remote history of smoking.  Past Medical History:  Diagnosis Date  . Arthritis   . Hyperlipidemia   . Hypertension   . Kidney stones    "never had OR" (04/09/2014)  . Kidney stones   . Osteoarthritis of left knee 04/09/2014  . Pneumonia    hx of    Patient Active Problem List   Diagnosis Date Noted  . Rib fractures 09/22/2018  . Leg swelling 08/14/2018  . OSA (obstructive sleep apnea) 07/06/2018  . DOE (dyspnea on exertion) 07/05/2018  . Nausea and vomiting 11/29/2014  . CAP (community acquired pneumonia) 11/29/2014  . Benign essential HTN 11/29/2014  . Nausea with vomiting   . Osteoarthritis of left knee 04/09/2014  . Knee osteoarthritis 04/09/2014  . HYPERCHOLESTEROLEMIA 03/05/2008  . HYPERLIPIDEMIA 03/05/2008  . INTERNAL HEMORRHOIDS 03/05/2008  . GERD without esophagitis 03/05/2008  . Allergic rhinitis 03/05/2008    Past Surgical History:  Procedure Laterality Date  . APPENDECTOMY  2007  . CARDIAC CATHETERIZATION  ~ 2003  . FOOT NEUROMA SURGERY    . KNEE ARTHROSCOPY Left 04/09/2014   Procedure: ARTHROSCOPY KNEE;  Surgeon: Eulas Post, MD;  Location: [facility] OR;  Service: Orthopedics;  Laterality: Left;  . NASAL SEPTUM SURGERY  1981  . PARTIAL KNEE ARTHROPLASTY Left 04/09/2014   Procedure: UNICOMPARTMENTAL KNEE;  Surgeon: Eulas Post, MD;  Location: [facility] OR;  Service: Orthopedics;  Laterality: Left;  . REPLACEMENT UNICONDYLAR JOINT KNEE Left 04/09/2014  . TONSILLECTOMY AND ADENOIDECTOMY  1972        Home Medications    Prior to Admission medications   Medication Sig Start Date End Date Taking? Authorizing Provider  benzonatate (TESSALON) 100 MG capsule Take 1 capsule (100 mg total) by mouth every 8 (eight) hours. 01/12/21  Yes Hall-Potvin, [place], PA-C  predniSONE (STERAPRED UNI-PAK 21 TAB) 10 MG (21) TBPK tablet Take by mouth daily. Take steroid taper as written 01/12/21  Yes Hall-Potvin, [place], PA-C  carvedilol (COREG) 12.5 MG tablet Take 12.5 mg by mouth 2 (two) times daily. 05/02/18   [provider]  diphenhydrAMINE (BENADRYL) 25 MG tablet Take 2 tablets (50 mg total) by mouth every 6 (six) hours. Take 1-2 tablets every 6 hours x 2 days, then space out to an as needed basis Patient not taking: Reported on 09/21/2018 11/28/14   Phillis Haggis, MD  fluticasone [facility]) 50 MCG/ACT nasal spray Place 2 sprays into both nostrils daily as needed for allergies. 06/05/18   [provider]  furosemide (LASIX) 20 MG tablet Take 1 tablet (20 mg total) by mouth daily as needed. 09/29/18   Glenford Bayley, NP  hydrochlorothiazide (HYDRODIURIL) 25 MG tablet Take 1 tablet by mouth Daily. 04/12/12   [provider]  ibuprofen (ADVIL,MOTRIN) 800 MG tablet Take 1 tablet (800 mg total) by mouth 3 (three) times daily. 09/22/18   Glenford Bayley, NP  lidocaine (LIDODERM) 5 % Place 1 patch onto the skin daily. Remove & Discard patch within  12 hours or as directed by MD 09/21/18   Sabas SousBero, Michael M, MD  LIPITOR 40 MG tablet Take 20 mg by mouth every other day.  04/17/12   [provider]  meloxicam (MOBIC) 15 MG tablet Take 15 mg by mouth daily.    [provider]  Multiple Vitamins-Minerals (CENTRUM SILVER 50+MEN) TABS Take 1 tablet by mouth daily.    [provider]  valsartan (DIOVAN) 160 MG tablet Take 1 tablet (160 mg total) by mouth daily. 07/05/18   Nyoka CowdenWert, Michael B, MD    Family History Family History  Problem Relation Age of Onset  .  Heart disease Mother   . Heart disease Father   . Heart disease Brother   . Heart disease Paternal Grandfather   . Colon cancer Neg Hx   . Esophageal cancer Neg Hx   . Rectal cancer Neg Hx   . Stomach cancer Neg Hx     Social History Social History   Tobacco Use  . Smoking status: Former Smoker    Packs/day: 0.50    Years: 32.00    Pack years: 16.00    Types: Cigarettes  . Smokeless tobacco: Never Used  . Tobacco comment: 04/09/2014 "quit smoking ~ 2008"  Substance Use Topics  . Alcohol use: Yes    Alcohol/week: 3.0 standard drinks    Types: 3 Cans of beer per week  . Drug use: No     Allergies   Bee venom, Percocet [oxycodone-acetaminophen], and Sulfa antibiotics   Review of Systems Review of Systems  Constitutional: Negative for fatigue and fever.  HENT: Positive for congestion, sinus pressure and sinus pain. Negative for dental problem, ear pain, facial swelling, hearing loss, sore throat, trouble swallowing and voice change.   Eyes: Negative for photophobia, pain and visual disturbance.  Respiratory: Positive for cough. Negative for chest tightness, shortness of breath and wheezing.   Cardiovascular: Negative for chest pain and palpitations.  Gastrointestinal: Negative for diarrhea and vomiting.  Musculoskeletal: Negative for arthralgias and myalgias.  Neurological: Negative for dizziness and headaches.     Physical Exam Triage Vital Signs ED Triage Vitals  Enc Vitals Group     BP 01/12/21 1812 (!) 151/95     Pulse Rate 01/12/21 1812 77     Resp 01/12/21 1812 20     Temp 01/12/21 1812 97.6 F (36.4 C)     Temp Source 01/12/21 1812 Oral     SpO2 01/12/21 1812 96 %     Weight 01/12/21 1813 205 lb (93 kg)     Height 01/12/21 1813 5\' 10"  (1.778 m)     Head Circumference --      Peak Flow --      Pain Score 01/12/21 1813 5     Pain Loc --      Pain Edu? --      Excl. in GC? --    No data found.  Updated Vital Signs BP (!) 151/95 (BP Location: Left  Arm)   Pulse 77   Temp 97.6 F (36.4 C) (Oral)   Resp 20   Ht 5\' 10"  (1.778 m)   Wt 205 lb (93 kg)   SpO2 96%   BMI 29.41 kg/m   Visual Acuity Right Eye Distance:   Left Eye Distance:   Bilateral Distance:    Right Eye Near:   Left Eye Near:    Bilateral Near:     Physical Exam Constitutional:      General: He is  not in acute distress.    Appearance: He is not toxic-appearing or diaphoretic.  HENT:     Head: Normocephalic and atraumatic.     Right Ear: Tympanic membrane, ear canal and external ear normal.     Left Ear: Tympanic membrane, ear canal and external ear normal.     Mouth/Throat:     Mouth: Mucous membranes are moist.     Pharynx: Oropharynx is clear.  Eyes:     General: No scleral icterus.    Extraocular Movements: Extraocular movements intact.     Conjunctiva/sclera: Conjunctivae normal.     Pupils: Pupils are equal, round, and reactive to light.  Neck:     Comments: Trachea midline, negative JVD Cardiovascular:     Rate and Rhythm: Normal rate and regular rhythm.  Pulmonary:     Effort: Pulmonary effort is normal. No respiratory distress.     Breath sounds: No wheezing.  Musculoskeletal:        General: No deformity. Normal range of motion.     Cervical back: Normal range of motion and neck supple. No rigidity or tenderness.  Lymphadenopathy:     Cervical: No cervical adenopathy.  Skin:    Capillary Refill: Capillary refill takes less than 2 seconds.     Coloration: Skin is not jaundiced or pale.     Findings: No bruising or rash.  Neurological:     Mental Status: He is alert and oriented to person, place, and time.     Cranial Nerves: Cranial nerves are intact.     Sensory: Sensation is intact.     Motor: Motor function is intact.     Coordination: Coordination is intact.     Gait: Gait is intact.  Psychiatric:        Mood and Affect: Mood normal.        Behavior: Behavior normal.      UC Treatments / Results  Labs (all labs ordered  are listed, but only abnormal results are displayed) Labs Reviewed - No data to display  EKG   Radiology No results found.  Procedures Procedures (including critical care time)  Medications Ordered in UC Medications - No data to display  Initial Impression / Assessment and Plan / UC Course  I have reviewed the triage vital signs and the nursing notes.  Pertinent labs & imaging results that were available during my care of the patient were reviewed by me and considered in my medical decision making (see chart for details).     Patient with known COVID 19 infection.  Afebrile, nontoxic, no acute respiratory distress.  Exam reassuring at this time.  No neurocognitive deficit.  Will treat supportively as below.  Return precautions discussed, pt verbalized understanding and is agreeable to plan. Final Clinical Impressions(s) / UC Diagnoses   Final diagnoses:  URI with cough and congestion     Discharge Instructions     Tessalon for cough. Start flonase, atrovent nasal spray for nasal congestion/drainage. You can use over the counter nasal saline rinse such as neti pot for nasal congestion. Keep hydrated, your urine should be clear to pale yellow in color. Tylenol/motrin for fever and pain. Monitor for any worsening of symptoms, chest pain, shortness of breath, wheezing, swelling of the throat, go to the emergency department for further evaluation needed.     ED Prescriptions    Medication Sig Dispense Auth. Provider   predniSONE (STERAPRED UNI-PAK 21 TAB) 10 MG (21) TBPK tablet Take by mouth daily. Take steroid  taper as written 21 tablet Hall-Potvin, [place], PA-C   benzonatate (TESSALON) 100 MG capsule Take 1 capsule (100 mg total) by mouth every 8 (eight) hours. 21 capsule Hall-Potvin, [place], PA-C     PDMP not reviewed this encounter.   Hall-Potvin, [place], [place] 01/12/21 1851

## 2021-01-12 NOTE — Discharge Instructions (Signed)

## 2021-08-06 ENCOUNTER — Telehealth: Payer: Self-pay

## 2021-08-06 NOTE — Telephone Encounter (Signed)
Referral notes received from [facility] [place], Phone #: [phone], Fax #: [phone]   A copy of the notes have been placed in the scheduling box for check-out to pick-up and to enter referral. Original notes placed in file cabinet.

## 2021-10-15 ENCOUNTER — Ambulatory Visit: Payer: BC Managed Care – PPO | Admitting: Internal Medicine

## 2021-10-15 ENCOUNTER — Other Ambulatory Visit: Payer: Self-pay

## 2021-10-15 ENCOUNTER — Encounter: Payer: Self-pay | Admitting: Internal Medicine

## 2021-10-15 VITALS — BP 110/68 | HR 61 | Ht 70.0 in | Wt 199.6 lb

## 2021-10-15 DIAGNOSIS — I2510 Atherosclerotic heart disease of native coronary artery without angina pectoris: Secondary | ICD-10-CM

## 2021-10-15 DIAGNOSIS — E7800 Pure hypercholesterolemia, unspecified: Secondary | ICD-10-CM | POA: Diagnosis not present

## 2021-10-15 DIAGNOSIS — Z79899 Other long term (current) drug therapy: Secondary | ICD-10-CM | POA: Diagnosis not present

## 2021-10-15 DIAGNOSIS — I2584 Coronary atherosclerosis due to calcified coronary lesion: Secondary | ICD-10-CM

## 2021-10-15 DIAGNOSIS — I10 Essential (primary) hypertension: Secondary | ICD-10-CM | POA: Diagnosis not present

## 2021-10-15 NOTE — Progress Notes (Signed)
Cardiology Office Note:    Date:  10/15/2021   ID:  Brandon King, DOB Oct 15, 1961, MRN [id number]  PCP:  Barbie Banner, MD  Cardiologist:  None  Electrophysiologist:  None   Referring MD: Barbie Banner, MD   Chief Complaint/Reason for Referral: Hypertension  History of Present Illness:    Brandon King is a 60 y.o. male with a history of hyperlipidemia, hypertension, arthritis, and renal calculi, here for the evaluation of hypertension at the request of Dr. Andrey Campanile.  He was last seen by Dr. Andrey Campanile 08/04/2021. At that visit his blood pressure was well-controlled. HCTZ was reduced from 25 mg daily to 12.5 mg daily. He also reported feelings of faintness and "everything goes black" every time he looks up. Reportedly had not used his CPAP since having a COVID infection last winter.  Overall, he is feeling well and denies any major cardiovascular issues. He endorses known coronary artery calcification. Previous stress test in 2016 was reportedly normal. Also notes that testing performed in [facility] (2000) showed a 20% blockage.  Initially he was diagnosed with hypertension and hyperlipidemia at 60 yo, with medication management beginning at that time. Without medication, he has noticed his blood pressure may be as high as 210/150. In 02/2021, his labs revealed LDL 80, Triglycerides 88, HDL 52. He remains compliant with Lipitor.  Formerly he was a smoker. He had COVID about 1 year ago. Subsequent to his infection, he was unable to climb stairs without becoming winded. He has since recovered. Occasionally he still has shortness of breath, but feels like he does recover quickly.  Since 05/2019 he has lost about 60 lbs overall.  Father died of heart attack at 34 yo. Brother died of heart attack at 2 yo.  The patient denies chest pain, chest pressure, palpitations, PND, orthopnea, or leg swelling. Denies cough, fever, chills. Denies nausea, vomiting. Denies syncope or presyncope. Denies  dizziness or lightheadedness. Denies snoring.   Past Medical History:  Diagnosis Date   Arthritis    Hyperlipidemia    Hypertension    Kidney stones    "never had OR" (04/09/2014)   Kidney stones    Osteoarthritis of left knee 04/09/2014   Pneumonia    hx of    Past Surgical History:  Procedure Laterality Date   APPENDECTOMY  2007   CARDIAC CATHETERIZATION  ~ 2003   FOOT NEUROMA SURGERY     KNEE ARTHROSCOPY Left 04/09/2014   Procedure: ARTHROSCOPY KNEE;  Surgeon: Eulas Post, MD;  Location: [facility] OR;  Service: Orthopedics;  Laterality: Left;   NASAL SEPTUM SURGERY  1981   PARTIAL KNEE ARTHROPLASTY Left 04/09/2014   Procedure: UNICOMPARTMENTAL KNEE;  Surgeon: Eulas Post, MD;  Location: [facility] OR;  Service: Orthopedics;  Laterality: Left;   REPLACEMENT UNICONDYLAR JOINT KNEE Left 04/09/2014   TONSILLECTOMY AND ADENOIDECTOMY  1972    Current Medications: Current Meds  Medication Sig   carvedilol (COREG) 12.5 MG tablet Take 12.5 mg by mouth 2 (two) times daily.   diphenhydrAMINE (BENADRYL) 25 MG tablet Take 2 tablets (50 mg total) by mouth every 6 (six) hours. Take 1-2 tablets every 6 hours x 2 days, then space out to an as needed basis   EPINEPHrine 0.3 mg/0.3 mL IJ SOAJ injection epinephrine 0.3 mg/0.3 mL injection, auto-injector  INJECT AS DIRECTED FOR BEE STING REACTION   fluticasone (FLONASE) 50 MCG/ACT nasal spray Place 2 sprays into both nostrils daily as needed for allergies.   fluticasone (FLONASE) 50  MCG/ACT nasal spray Place into the nose.   hydrochlorothiazide (HYDRODIURIL) 25 MG tablet Take 1 tablet by mouth Daily.   LIPITOR 40 MG tablet Take 20 mg by mouth every other day.    meloxicam (MOBIC) 15 MG tablet Take 15 mg by mouth daily.   Multiple Vitamins-Minerals (CENTRUM SILVER 50+MEN) TABS Take 1 tablet by mouth daily.   valsartan (DIOVAN) 160 MG tablet Take 1 tablet (160 mg total) by mouth daily.     Allergies:   Bee venom, Percocet [oxycodone-acetaminophen], and  Sulfa antibiotics   Social History   Tobacco Use   Smoking status: Former    Packs/day: 0.50    Years: 32.00    Pack years: 16.00    Types: Cigarettes   Smokeless tobacco: Never   Tobacco comments:    04/09/2014 "quit smoking ~ 2008"  Substance Use Topics   Alcohol use: Yes    Alcohol/week: 3.0 standard drinks    Types: 3 Cans of beer per week   Drug use: No     Family History: The patient's family history includes Heart disease in his brother, father, mother, and paternal grandfather. There is no history of Colon cancer, Esophageal cancer, Rectal cancer, or Stomach cancer.  ROS:   Please see the history of present illness.    (+) Shortness of breath All other systems reviewed and are negative.  EKGs/Labs/Other Studies Reviewed:    The following studies were reviewed today:  CXR ([facility]) 02/06/2021: FINDINGS:   Cardiovascular: Cardiac silhouette and pulmonary vasculature are within normal limits.  Mediastinum: Within normal limits.  Lungs/pleura: Clear. No pneumothorax or pleural effusion.  Upper abdomen: Visualized portions are unremarkable.  Chest wall/osseous structures: Unremarkable.    CONCLUSION:   There is no evidence of acute cardiac or pulmonary abnormality.  CAT Scan Calcium Scoring ([facility]) 07/30/2015:   Holter Monitor 06/2018: Sinus bradycardia, normal sinus rhythm, sinus tachycardia. Average heart rate was 69 bpm. Heart rate ranged from 48 to 111 bpm. PACs, atrial triplets, geminal PACs, nonsustained atrial tachycardia up to 7 beats. Occasional PVCs with PVC load less than 1%.  EKG:  10/15/2021: Sinus rhythm. Rate 61 bpm.  Imaging studies that I have independently reviewed today: CT abd pelv 09/21/2018 - cor cals and aortic root calcifications.  Recent Labs: No results found for requested labs within last 8760 hours.   Recent Lipid Panel    Component Value Date/Time   CHOL 136 11/29/2014 1855   TRIG 118  11/29/2014 1855   HDL 28 (L) 11/29/2014 1855   CHOLHDL 4.9 11/29/2014 1855   VLDL 24 11/29/2014 1855   LDLCALC 84 11/29/2014 1855    Physical Exam:    VS:  BP 110/68   Pulse 61   Ht 5\' 10"  (1.778 m)   Wt 199 lb 9.6 oz (90.5 kg)   SpO2 99%   BMI 28.64 kg/m     Wt Readings from Last 5 Encounters:  10/15/21 199 lb 9.6 oz (90.5 kg)  01/12/21 205 lb (93 kg)  09/22/18 221 lb 6.4 oz (100.4 kg)  09/21/18 225 lb (102.1 kg)  08/14/18 231 lb (104.8 kg)    Constitutional: No acute distress Eyes: sclera non-icteric, normal conjunctiva and lids ENMT: normal dentition, moist mucous membranes Cardiovascular: regular rhythm, normal rate, no murmur. S1 and S2 normal. No jugular venous distention.  Respiratory: clear to auscultation bilaterally GI : normal bowel sounds, soft and nontender. No distention.   MSK: extremities warm, well perfused.  No edema.  NEURO: grossly nonfocal exam, moves all extremities. PSYCH: alert and oriented x 3, normal mood and affect.   ASSESSMENT:    1. Primary hypertension   2. Coronary artery calcification   3. HYPERCHOLESTEROLEMIA   4. Medication management    PLAN:    Primary hypertension - Plan: EKG 12-Lead - blood pressure well controlled today on carvedilol 12.5 mg BID, HCTZ 25 mg daily, and valsartan 160 mg daily. Continue at current doses.   Coronary artery calcification HYPERCHOLESTEROLEMIA Medication management - Plan: Lipid panel - cor cals noted on CT abd from 2019. Recommend intensifying statin therapy, discussed risks and benefits and he will attempt increase dose and report any symptoms.  -Increase Lipitor from 20 mg daily to 40 mg daily for 2-3 weeks. -Lipids in 6 months.  Follow-up in 6 months.  Brandon Brass, MD, [facility]  [facility]   Shared Decision Making/Informed Consent:       Medication Adjustments/Labs and Tests Ordered: Current medicines are reviewed at length with the patient today.  Concerns regarding  medicines are outlined above.   Orders Placed This Encounter  Procedures   Lipid panel   EKG 12-Lead     No orders of the defined types were placed in this encounter.    Patient Instructions  Medication Instructions:  Try to increase Lipitor to 40 mg daily for the next 2 - 3 weeks. After that, send [place] a message on how you tolerate this dose increase. *If you need a refill on your cardiac medications before your next appointment, please call your pharmacy*   Lab Work: Lipids (fasting) in 6 months If you have labs (blood work) drawn today and your tests are completely normal, you will receive your results only by: MyChart Message (if you have MyChart) OR A paper copy in the mail If you have any lab test that is abnormal or we need to change your treatment, we will call you to review the results.   Testing/Procedures: None ordered   Follow-Up: At [facility], you and your health needs are our priority.  As part of our continuing mission to provide you with exceptional heart care, we have created designated Provider Care Teams.  These Care Teams include your primary Cardiologist (physician) and Advanced Practice Providers (APPs -  Physician Assistants and Nurse Practitioners) who all work together to provide you with the care you need, when you need it.  We recommend signing up for the patient portal called "MyChart".  Sign up information is provided on this After Visit Summary.  MyChart is used to connect with patients for Virtual Visits (Telemedicine).  Patients are able to view lab/test results, encounter notes, upcoming appointments, etc.  Non-urgent messages can be sent to your provider as well.   To learn more about what you can do with MyChart, go to [email or web address].    Your next appointment:   6 month(s)  The format for your next appointment:   In Person  Provider:   Weston Brass, MD        [facility],acting as a scribe for Brandon Poisson,  MD.,have documented all relevant documentation on the behalf of Brandon Poisson, MD,as directed by  Brandon Poisson, MD while in the presence of Brandon Poisson, MD.  I, Brandon Poisson, MD, have reviewed all documentation for this visit. The documentation on today's date of service for the exam, diagnosis, procedures, and orders are all accurate and complete.

## 2021-10-15 NOTE — Patient Instructions (Signed)
Medication Instructions:  Try to increase Lipitor to 40 mg daily for the next 2 - 3 weeks. After that, send [place] a message on how you tolerate this dose increase. *If you need a refill on your cardiac medications before your next appointment, please call your pharmacy*   Lab Work: Lipids (fasting) in 6 months If you have labs (blood work) drawn today and your tests are completely normal, you will receive your results only by: MyChart Message (if you have MyChart) OR A paper copy in the mail If you have any lab test that is abnormal or we need to change your treatment, we will call you to review the results.   Testing/Procedures: None ordered   Follow-Up: At [facility], you and your health needs are our priority.  As part of our continuing mission to provide you with exceptional heart care, we have created designated Provider Care Teams.  These Care Teams include your primary Cardiologist (physician) and Advanced Practice Providers (APPs -  Physician Assistants and Nurse Practitioners) who all work together to provide you with the care you need, when you need it.  We recommend signing up for the patient portal called "MyChart".  Sign up information is provided on this After Visit Summary.  MyChart is used to connect with patients for Virtual Visits (Telemedicine).  Patients are able to view lab/test results, encounter notes, upcoming appointments, etc.  Non-urgent messages can be sent to your provider as well.   To learn more about what you can do with MyChart, go to [email or web address].    Your next appointment:   6 month(s)  The format for your next appointment:   In Person  Provider:   Weston Brass, MD

## 2021-12-18 ENCOUNTER — Ambulatory Visit
Admission: EM | Admit: 2021-12-18 | Discharge: 2021-12-18 | Disposition: A | Discharge status: Home / Self Care | Payer: BC Managed Care – PPO | Attending: Physician Assistant | Admitting: Physician Assistant

## 2021-12-18 ENCOUNTER — Encounter: Payer: Self-pay | Admitting: Emergency Medicine

## 2021-12-18 DIAGNOSIS — J069 Acute upper respiratory infection, unspecified: Secondary | ICD-10-CM

## 2021-12-18 MED ORDER — METHYLPREDNISOLONE SODIUM SUCC 125 MG IJ SOLR
80.0000 mg | Freq: Once | INTRAMUSCULAR | Status: AC
  Administered 2021-12-18: 17:00:00 80 mg via INTRAMUSCULAR

## 2021-12-18 NOTE — ED Triage Notes (Signed)
Nasal drainage, post nasal drip, cough since yesterday. Says this afternoon he feels as though his breathing has become more labored.

## 2021-12-18 NOTE — ED Provider Notes (Signed)
[facility]    CSN: [id number] Arrival date & time: 12/18/21  1620      History   Chief Complaint Chief Complaint  Patient presents with   Cough   Nasal Congestion    HPI Brandon King is a 60 y.o. male.   Patient here today for evaluation of nasal drainage, postnasal drip and cough that started yesterday.  He states this afternoon he does feel like his breathing is a somewhat more labored.  He has tried over-the-counter medication without significant relief.  He requests steroid injection as he has had this before with similar symptoms and it has been helpful.  The history is provided by the patient.  Cough Associated symptoms: no chills, no ear pain, no eye discharge, no fever, no shortness of breath and no sore throat    Past Medical History:  Diagnosis Date   Arthritis    Hyperlipidemia    Hypertension    Kidney stones    "never had OR" (04/09/2014)   Kidney stones    Osteoarthritis of left knee 04/09/2014   Pneumonia    hx of    Patient Active Problem List   Diagnosis Date Noted   Rib fractures 09/22/2018   Leg swelling 08/14/2018   OSA (obstructive sleep apnea) 07/06/2018   DOE (dyspnea on exertion) 07/05/2018   Nausea and vomiting 11/29/2014   CAP (community acquired pneumonia) 11/29/2014   Benign essential HTN 11/29/2014   Nausea with vomiting    Osteoarthritis of left knee 04/09/2014   Knee osteoarthritis 04/09/2014   HYPERCHOLESTEROLEMIA 03/05/2008   HYPERLIPIDEMIA 03/05/2008   INTERNAL HEMORRHOIDS 03/05/2008   GERD without esophagitis 03/05/2008   Allergic rhinitis 03/05/2008    Past Surgical History:  Procedure Laterality Date   APPENDECTOMY  2007   CARDIAC CATHETERIZATION  ~ 2003   FOOT NEUROMA SURGERY     KNEE ARTHROSCOPY Left 04/09/2014   Procedure: ARTHROSCOPY KNEE;  Surgeon: Eulas Post, MD;  Location: [facility] OR;  Service: Orthopedics;  Laterality: Left;   NASAL SEPTUM SURGERY  1981   PARTIAL KNEE ARTHROPLASTY Left  04/09/2014   Procedure: UNICOMPARTMENTAL KNEE;  Surgeon: Eulas Post, MD;  Location: [facility] OR;  Service: Orthopedics;  Laterality: Left;   REPLACEMENT UNICONDYLAR JOINT KNEE Left 04/09/2014   TONSILLECTOMY AND ADENOIDECTOMY  1972       Home Medications    Prior to Admission medications   Medication Sig Start Date End Date Taking? Authorizing Provider  benzonatate (TESSALON) 100 MG capsule Take 1 capsule (100 mg total) by mouth every 8 (eight) hours. Patient not taking: Reported on 10/15/2021 01/12/21   Hall-Potvin, [place], PA-C  carvedilol (COREG) 12.5 MG tablet Take 12.5 mg by mouth 2 (two) times daily. 05/02/18   [provider]  diphenhydrAMINE (BENADRYL) 25 MG tablet Take 2 tablets (50 mg total) by mouth every 6 (six) hours. Take 1-2 tablets every 6 hours x 2 days, then space out to an as needed basis 11/28/14   Mabe, Latanya Maudlin, MD  EPINEPHrine 0.3 mg/0.3 mL IJ SOAJ injection epinephrine 0.3 mg/0.3 mL injection, auto-injector  INJECT AS DIRECTED FOR BEE STING REACTION 02/28/20   [provider]  fluticasone (FLONASE) 50 MCG/ACT nasal spray Place 2 sprays into both nostrils daily as needed for allergies. 06/05/18   [provider]  fluticasone (FLONASE) 50 MCG/ACT nasal spray Place into the nose. 06/05/18   [provider]  furosemide (LASIX) 20 MG tablet Take 1 tablet (20 mg total) by mouth daily  as needed. Patient not taking: Reported on 10/15/2021 09/29/18   Glenford Bayley, NP  hydrochlorothiazide (HYDRODIURIL) 25 MG tablet Take 1 tablet by mouth Daily. 04/12/12   [provider]  ibuprofen (ADVIL,MOTRIN) 800 MG tablet Take 1 tablet (800 mg total) by mouth 3 (three) times daily. Patient not taking: Reported on 10/15/2021 09/22/18   Glenford Bayley, NP  lidocaine (LIDODERM) 5 % Place 1 patch onto the skin daily. Remove & Discard patch within 12 hours or as directed by MD Patient not taking: Reported on 10/15/2021 09/21/18   Sabas Sous, MD   LIPITOR 40 MG tablet Take 20 mg by mouth daily. 04/17/12   [provider]  meloxicam (MOBIC) 15 MG tablet Take 15 mg by mouth daily.    [provider]  Multiple Vitamins-Minerals (CENTRUM SILVER 50+MEN) TABS Take 1 tablet by mouth daily.    [provider]  predniSONE (STERAPRED UNI-PAK 21 TAB) 10 MG (21) TBPK tablet Take by mouth daily. Take steroid taper as written Patient not taking: Reported on 10/15/2021 01/12/21   Hall-Potvin, [place], PA-C  valsartan (DIOVAN) 160 MG tablet Take 1 tablet (160 mg total) by mouth daily. 07/05/18   Nyoka Cowden, MD    Family History Family History  Problem Relation Age of Onset   Heart disease Mother    Heart disease Father    Heart disease Brother    Heart disease Paternal Grandfather    Colon cancer Neg Hx    Esophageal cancer Neg Hx    Rectal cancer Neg Hx    Stomach cancer Neg Hx     Social History Social History   Tobacco Use   Smoking status: Former    Packs/day: 0.50    Years: 32.00    Pack years: 16.00    Types: Cigarettes   Smokeless tobacco: Never   Tobacco comments:    04/09/2014 "quit smoking ~ 2008"  Substance Use Topics   Alcohol use: Yes    Alcohol/week: 3.0 standard drinks    Types: 3 Cans of beer per week   Drug use: No     Allergies   Bee venom, Percocet [oxycodone-acetaminophen], and Sulfa antibiotics   Review of Systems Review of Systems  Constitutional:  Negative for chills and fever.  HENT:  Positive for congestion. Negative for ear pain and sore throat.   Eyes:  Negative for discharge and redness.  Respiratory:  Positive for cough. Negative for shortness of breath.   Gastrointestinal:  Negative for abdominal pain, nausea and vomiting.    Physical Exam Triage Vital Signs ED Triage Vitals [12/18/21 1634]  Enc Vitals Group     BP (!) 154/79     Pulse Rate 72     Resp 16     Temp 97.9 F (36.6 C)     Temp Source Oral     SpO2 98 %     Weight      Height      Head  Circumference      Peak Flow      Pain Score 0     Pain Loc      Pain Edu?      Excl. in GC?    No data found.  Updated Vital Signs BP (!) 154/79 (BP Location: Right Arm)    Pulse 72    Temp 97.9 F (36.6 C) (Oral)    Resp 16    SpO2 98%      Physical Exam Vitals and  nursing note reviewed.  Constitutional:      General: He is not in acute distress.    Appearance: Normal appearance. He is not ill-appearing.  HENT:     Head: Normocephalic and atraumatic.     Nose: Congestion present.     Mouth/Throat:     Mouth: Mucous membranes are moist.     Pharynx: Oropharynx is clear. No oropharyngeal exudate or posterior oropharyngeal erythema.  Eyes:     Conjunctiva/sclera: Conjunctivae normal.  Cardiovascular:     Rate and Rhythm: Normal rate and regular rhythm.     Heart sounds: Normal heart sounds. No murmur heard. Pulmonary:     Effort: Pulmonary effort is normal. No respiratory distress.     Breath sounds: Normal breath sounds. No wheezing, rhonchi or rales.  Skin:    General: Skin is warm and dry.  Neurological:     Mental Status: He is alert.  Psychiatric:        Mood and Affect: Mood normal.        Thought Content: Thought content normal.     UC Treatments / Results  Labs (all labs ordered are listed, but only abnormal results are displayed) Labs Reviewed  COVID-19, FLU A+B NAA    EKG   Radiology No results found.  Procedures Procedures (including critical care time)  Medications Ordered in UC Medications  methylPREDNISolone sodium succinate (SOLU-MEDROL) 125 mg/2 mL injection 80 mg (80 mg Intramuscular Given 12/18/21 1702)    Initial Impression / Assessment and Plan / UC Course  I have reviewed the triage vital signs and the nursing notes.  Pertinent labs & imaging results that were available during my care of the patient were reviewed by me and considered in my medical decision making (see chart for details).    Suspect viral etiology of symptoms.   Will treat with steroid injection as requested but discussed possible issues if in fact patient is diagnosed with COVID.  Patient request steroid injection regardless.  Will await results for further recommendation.  Final Clinical Impressions(s) / UC Diagnoses   Final diagnoses:  Acute upper respiratory infection   Discharge Instructions   None    ED Prescriptions   None    PDMP not reviewed this encounter.   Tomi Bamberger, PA-C 12/18/21 [phone]

## 2021-12-19 LAB — COVID-19, FLU A+B NAA
Influenza A, NAA: NOT DETECTED
Influenza B, NAA: NOT DETECTED
SARS-CoV-2, NAA: NOT DETECTED

## 2022-02-18 ENCOUNTER — Ambulatory Visit (INDEPENDENT_AMBULATORY_CARE_PROVIDER_SITE_OTHER): Payer: BC Managed Care – PPO | Admitting: Podiatry

## 2022-02-18 ENCOUNTER — Encounter: Payer: Self-pay | Admitting: Podiatry

## 2022-02-18 ENCOUNTER — Other Ambulatory Visit: Payer: Self-pay

## 2022-02-18 DIAGNOSIS — L600 Ingrowing nail: Secondary | ICD-10-CM | POA: Diagnosis not present

## 2022-02-18 NOTE — Patient Instructions (Signed)

## 2022-02-19 NOTE — Progress Notes (Signed)
Subjective:   Patient ID: Brandon King, male   DOB: 61 y.o.   MRN: [id number]   HPI Patient presents with painful ingrown toenails of the fourth toe both feet stating that they have really been bothering him for the last few months and he does not remember injury or anything else he is done differently.  Patient is active with work does not smoke currently and likes to be active   Review of Systems  All other systems reviewed and are negative.      Objective:  Physical Exam Vitals and nursing note reviewed.  Constitutional:      Appearance: He is well-developed.  Pulmonary:     Effort: Pulmonary effort is normal.  Musculoskeletal:        General: Normal range of motion.  Skin:    General: Skin is warm.  Neurological:     Mental Status: He is alert.    Neurovascular status intact muscle strength found to be adequate range of motion within normal limits.  Patient is found to have incurvated lateral border fourth toe both feet that are painful in the distal portion no erythema no edema present and mild structural changes of the toe itself but no corn callus formation.  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 that are incurvated and painful with no indication of infective process with a slight possibility that this may involve the digital structure itself but most likely strictly the nail     Plan:  H&P education rendered allowed him to read and signed consent form understanding risk and today I went ahead and I infiltrated each fourth toe 60 mg like Marcaine mixture sterile prep done and using sterile instrumentation removed the lateral borders of the fourth toe exposed matrix applied phenol 3 applications 30 seconds followed by alcohol lavage and sterile dressing gave instructions  on soaks and to leave dressings on 24 hours but take them off earlier if throbbing were to occur.  Encouraged wider shoe gear and patient will be seen back as symptoms indicate but understands these will take a number of weeks to heal completely

## 2022-06-04 ENCOUNTER — Encounter: Payer: Self-pay | Admitting: Gastroenterology

## 2022-07-28 ENCOUNTER — Telehealth: Payer: Self-pay | Admitting: Internal Medicine

## 2022-07-28 NOTE — Telephone Encounter (Signed)
Patient has appointment with Dr. Jacques Navy on 8/7. He had blood work in March 2023 (care everywhere). Does patient need another fasting lipid panel before his 8/7 appointment?

## 2022-07-28 NOTE — Telephone Encounter (Signed)
Left message on personal cell phone that lab work is not needed before his 8/7 appointment with Dr. Jacques Navy; that his blood levels looked good from March lab draw.

## 2022-07-28 NOTE — Telephone Encounter (Signed)
Patient calling to see if he suppose to have labs done before his appt. Please advise

## 2022-08-02 ENCOUNTER — Ambulatory Visit (INDEPENDENT_AMBULATORY_CARE_PROVIDER_SITE_OTHER): Payer: BC Managed Care – PPO | Admitting: Internal Medicine

## 2022-08-02 ENCOUNTER — Encounter: Payer: Self-pay | Admitting: Internal Medicine

## 2022-08-02 VITALS — BP 131/70 | HR 58 | Ht 70.0 in | Wt 199.0 lb

## 2022-08-02 DIAGNOSIS — I2584 Coronary atherosclerosis due to calcified coronary lesion: Secondary | ICD-10-CM

## 2022-08-02 DIAGNOSIS — I2510 Atherosclerotic heart disease of native coronary artery without angina pectoris: Secondary | ICD-10-CM | POA: Diagnosis not present

## 2022-08-02 DIAGNOSIS — I10 Essential (primary) hypertension: Secondary | ICD-10-CM | POA: Diagnosis not present

## 2022-08-02 DIAGNOSIS — E7800 Pure hypercholesterolemia, unspecified: Secondary | ICD-10-CM | POA: Diagnosis not present

## 2022-08-02 NOTE — Progress Notes (Signed)
Cardiology Office Note:    Date:  08/02/2022   ID:  LLIAM HOH, DOB 12/04/1961, MRN [id number]  PCP:  Barbie Banner, MD  Cardiologist:  Parke Poisson, MD  Electrophysiologist:  None   Referring MD: Barbie Banner, MD   Chief Complaint/Reason for Referral: Hypertension  History of Present Illness:    Brandon King is a 61 y.o. male with a history of hyperlipidemia, hypertension, arthritis, and renal calculi, here for the evaluation of hypertension at the request of Dr. Andrey Campanile.  Initially he was diagnosed with hypertension and hyperlipidemia at 61 yo, with medication management beginning at that time. Without medication, he has noticed his blood pressure may be as high as 210/150. BP meds now stable doses, he is also now on doxazosin 4 mg daily for BPH.  No significant hypotension, the dizzy spells he was having before where the room would get black if he looked up have improved more recently and happen only rarely.  In 02/2021, his labs revealed LDL 80, Triglycerides 88, HDL 52. He remains compliant with Lipitor at increased dose from last visit. LDL now 67.  Formerly he was a smoker. He had COVID previously. Still with mild DOE but improving.   Father died of heart attack at 32 yo. Brother died of heart attack at 59 yo.  The patient denies chest pain, chest pressure, palpitations, PND, orthopnea, or leg swelling. Denies cough, fever, chills. Denies nausea, vomiting. Denies syncope or presyncope. Denies dizziness or lightheadedness. Denies snoring.   Past Medical History:  Diagnosis Date   Arthritis    Hyperlipidemia    Hypertension    Kidney stones    "never had OR" (04/09/2014)   Kidney stones    Osteoarthritis of left knee 04/09/2014   Pneumonia    hx of    Past Surgical History:  Procedure Laterality Date   APPENDECTOMY  2007   CARDIAC CATHETERIZATION  ~ 2003   FOOT NEUROMA SURGERY     KNEE ARTHROSCOPY Left 04/09/2014   Procedure: ARTHROSCOPY KNEE;  Surgeon:  Eulas Post, MD;  Location: [facility] OR;  Service: Orthopedics;  Laterality: Left;   NASAL SEPTUM SURGERY  1981   PARTIAL KNEE ARTHROPLASTY Left 04/09/2014   Procedure: UNICOMPARTMENTAL KNEE;  Surgeon: Eulas Post, MD;  Location: [facility] OR;  Service: Orthopedics;  Laterality: Left;   REPLACEMENT UNICONDYLAR JOINT KNEE Left 04/09/2014   TONSILLECTOMY AND ADENOIDECTOMY  1972    Current Medications: Current Meds  Medication Sig   carvedilol (COREG) 12.5 MG tablet Take 12.5 mg by mouth 2 (two) times daily.   doxazosin (CARDURA) 4 MG tablet Take 4 mg by mouth daily.   EPINEPHrine 0.3 mg/0.3 mL IJ SOAJ injection epinephrine 0.3 mg/0.3 mL injection, auto-injector  INJECT AS DIRECTED FOR BEE STING REACTION   hydrochlorothiazide (HYDRODIURIL) 12.5 MG tablet TAKE 1 TABLET BY MOUTH DAILY TO CONTROL BLOOD PRESSURE.   ibuprofen (ADVIL,MOTRIN) 800 MG tablet Take 1 tablet (800 mg total) by mouth 3 (three) times daily.   lidocaine (LIDODERM) 5 % Place 1 patch onto the skin daily. Remove & Discard patch within 12 hours or as directed by MD   LIPITOR 40 MG tablet Take 20 mg by mouth daily.   meloxicam (MOBIC) 15 MG tablet Take 15 mg by mouth daily.   Multiple Vitamins-Minerals (CENTRUM SILVER 50+MEN) TABS Take 1 tablet by mouth daily.   valsartan (DIOVAN) 160 MG tablet Take 1 tablet (160 mg total) by mouth daily.   [DISCONTINUED] hydrochlorothiazide (  HYDRODIURIL) 25 MG tablet Take 1 tablet by mouth Daily.     Allergies:   Bee venom, Percocet [oxycodone-acetaminophen], and Sulfa antibiotics   Social History   Tobacco Use   Smoking status: Former    Packs/day: 0.50    Years: 32.00    Total pack years: 16.00    Types: Cigarettes   Smokeless tobacco: Never   Tobacco comments:    04/09/2014 "quit smoking ~ 2008"  Substance Use Topics   Alcohol use: Yes    Alcohol/week: 3.0 standard drinks of alcohol    Types: 3 Cans of beer per week   Drug use: No     Family History: The patient's family history  includes Heart disease in his brother, father, mother, and paternal grandfather. There is no history of Colon cancer, Esophageal cancer, Rectal cancer, or Stomach cancer.  ROS:   Please see the history of present illness.    (+) Shortness of breath All other systems reviewed and are negative.  EKGs/Labs/Other Studies Reviewed:    The following studies were reviewed today:  CXR ([facility]) 02/06/2021: FINDINGS:   Cardiovascular: Cardiac silhouette and pulmonary vasculature are within normal limits.  Mediastinum: Within normal limits.  Lungs/pleura: Clear. No pneumothorax or pleural effusion.  Upper abdomen: Visualized portions are unremarkable.  Chest wall/osseous structures: Unremarkable.    CONCLUSION:   There is no evidence of acute cardiac or pulmonary abnormality.  CAT Scan Calcium Scoring ([facility]) 07/30/2015:   Holter Monitor 06/2018: Sinus bradycardia, normal sinus rhythm, sinus tachycardia. Average heart rate was 69 bpm. Heart rate ranged from 48 to 111 bpm. PACs, atrial triplets, geminal PACs, nonsustained atrial tachycardia up to 7 beats. Occasional PVCs with PVC load less than 1%.  EKG: 08/02/22: sinus brady 10/15/2021: Sinus rhythm. Rate 61 bpm.  Imaging studies that I have independently reviewed today: CT abd pelv 09/21/2018 - cor cals and aortic root calcifications.  Recent Labs: No results found for requested labs within last 365 days.   Recent Lipid Panel    Component Value Date/Time   CHOL 136 11/29/2014 1855   TRIG 118 11/29/2014 1855   HDL 28 (L) 11/29/2014 1855   CHOLHDL 4.9 11/29/2014 1855   VLDL 24 11/29/2014 1855   LDLCALC 84 11/29/2014 1855    Physical Exam:    VS:  BP 131/70   Pulse (!) 58   Ht 5\' 10"  (1.778 m)   Wt 199 lb (90.3 kg)   SpO2 98%   BMI 28.55 kg/m     Wt Readings from Last 5 Encounters:  08/02/22 199 lb (90.3 kg)  10/15/21 199 lb 9.6 oz (90.5 kg)  01/12/21 205 lb (93 kg)  09/22/18 221  lb 6.4 oz (100.4 kg)  09/21/18 225 lb (102.1 kg)    Constitutional: No acute distress Eyes: sclera non-icteric, normal conjunctiva and lids ENMT: normal dentition, moist mucous membranes Cardiovascular: regular rhythm, normal rate, no murmur. S1 and S2 normal. No jugular venous distention.  Respiratory: clear to auscultation bilaterally GI : normal bowel sounds, soft and nontender. No distention.   MSK: extremities warm, well perfused. No edema.  NEURO: grossly nonfocal exam, moves all extremities. PSYCH: alert and oriented x 3, normal mood and affect.   ASSESSMENT:    1. Primary hypertension   2. Coronary artery calcification   3. HYPERCHOLESTEROLEMIA     PLAN:    Primary hypertension - Plan: EKG 12-Lead - blood pressure well controlled today on carvedilol 12.5 mg BID,  HCTZ 12.5 mg daily, and valsartan 160 mg daily. Continue at current doses.  He also takes doxazosin 4 mg daily for BPH, if his dizzy spells continue, we may want to reduce medication further, however blood pressure is under good control today.  Coronary artery calcification HYPERCHOLESTEROLEMIA - cor cals noted on CT abd from 2019 -Lipitor increased from 20 mg daily to 40 mg daily at our last visit, tolerating well.  LDL is 67, remainder of lipid panel under good control.  Total time of encounter: 30 minutes total time of encounter, including 20 minutes spent in face-to-face patient care on the date of this encounter. This time includes coordination of care and counseling regarding above mentioned problem list. Remainder of non-face-to-face time involved reviewing chart documents/testing relevant to the patient encounter and documentation in the medical record. I have independently reviewed documentation from referring provider.   Weston Brass, MD, [facility]  [facility]    Shared Decision Making/Informed Consent:       Medication Adjustments/Labs and Tests Ordered: Current medicines are reviewed  at length with the patient today.  Concerns regarding medicines are outlined above.   Orders Placed This Encounter  Procedures   EKG 12-Lead     No orders of the defined types were placed in this encounter.    Patient Instructions  Medication Instructions:  No Changes In Medications at this time.  *If you need a refill on your cardiac medications before your next appointment, please call your pharmacy*  Lab Work: None Ordered At This Time.  If you have labs (blood work) drawn today and your tests are completely normal, you will receive your results only by: MyChart Message (if you have MyChart) OR A paper copy in the mail If you have any lab test that is abnormal or we need to change your treatment, we will call you to review the results.  Testing/Procedures: None Ordered At This Time.   Follow-Up: At [facility], you and your health needs are our priority.  As part of our continuing mission to provide you with exceptional heart care, we have created designated Provider Care Teams.  These Care Teams include your primary Cardiologist (physician) and Advanced Practice Providers (APPs -  Physician Assistants and Nurse Practitioners) who all work together to provide you with the care you need, when you need it.  Your next appointment:   1 year(s)  The format for your next appointment:   In Person  Provider:   Parke Poisson, MD

## 2022-08-02 NOTE — Patient Instructions (Signed)
Medication Instructions:  No Changes In Medications at this time.  *If you need a refill on your cardiac medications before your next appointment, please call your pharmacy*  Lab Work: None Ordered At This Time.   If you have labs (blood work) drawn today and your tests are completely normal, you will receive your results only by: MyChart Message (if you have MyChart) OR A paper copy in the mail If you have any lab test that is abnormal or we need to change your treatment, we will call you to review the results.  Testing/Procedures: None Ordered At This Time.   Follow-Up: At [facility], you and your health needs are our priority.  As part of our continuing mission to provide you with exceptional heart care, we have created designated Provider Care Teams.  These Care Teams include your primary Cardiologist (physician) and Advanced Practice Providers (APPs -  Physician Assistants and Nurse Practitioners) who all work together to provide you with the care you need, when you need it.  Your next appointment:   1 year(s)  The format for your next appointment:   In Person  Provider:   Gayatri A Acharya, MD     

## 2022-09-28 ENCOUNTER — Ambulatory Visit (AMBULATORY_SURGERY_CENTER): Payer: Self-pay | Admitting: *Deleted

## 2022-09-28 VITALS — Ht 70.0 in | Wt 192.0 lb

## 2022-09-28 DIAGNOSIS — Z1211 Encounter for screening for malignant neoplasm of colon: Secondary | ICD-10-CM

## 2022-09-28 MED ORDER — NA SULFATE-K SULFATE-MG SULF 17.5-3.13-1.6 GM/177ML PO SOLN: 1.0000 | Freq: Once | ORAL | 0 refills | Status: AC

## 2022-09-28 NOTE — Progress Notes (Signed)
No egg or soy allergy known to patient  No issues known to pt with past sedation with any surgeries or procedures Patient denies ever being told they had issues or difficulty with intubation  No FH of Malignant Hyperthermia Pt is not on diet pills Pt is not on  home 02  Pt is not on blood thinners  Pt denies issues with constipation  No A fib or A flutter Have any cardiac testing pending--no Pt instructed to use Singlecare.com or GoodRx for a price reduction on prep   

## 2022-10-19 ENCOUNTER — Encounter: Payer: Self-pay | Admitting: Gastroenterology

## 2022-10-21 ENCOUNTER — Encounter: Payer: Self-pay | Admitting: Certified Registered Nurse Anesthetist

## 2022-10-28 ENCOUNTER — Ambulatory Visit (AMBULATORY_SURGERY_CENTER): Payer: BC Managed Care – PPO | Admitting: Gastroenterology

## 2022-10-28 ENCOUNTER — Encounter: Payer: Self-pay | Admitting: Gastroenterology

## 2022-10-28 VITALS — BP 135/84 | HR 56 | Temp 97.3°F | Resp 18 | Ht 70.0 in | Wt 192.0 lb

## 2022-10-28 DIAGNOSIS — Z1211 Encounter for screening for malignant neoplasm of colon: Secondary | ICD-10-CM

## 2022-10-28 HISTORY — PX: COLONOSCOPY: SHX174

## 2022-10-28 MED ORDER — SODIUM CHLORIDE 0.9 % IV SOLN: 500.0000 mL | Freq: Once | INTRAVENOUS | Status: DC

## 2022-10-28 NOTE — Progress Notes (Signed)
History & Physical  Primary Care Physician:  Christain Sacramento, MD Primary Gastroenterologist: Lucio Edward, MD  CHIEF COMPLAINT:  CRC screening   HPI: Brandon King is a 61 y.o. male who is average risk CRC screening for colonoscopy.   Past Medical History:  Diagnosis Date   Allergy    Arthritis    GERD (gastroesophageal reflux disease)    Hyperlipidemia    Hypertension    Kidney stones    "never had OR" (04/09/2014)   Kidney stones    Osteoarthritis of left knee 04/09/2014   Pneumonia    hx of   Sleep apnea 2017   no longer uses CPAP    Past Surgical History:  Procedure Laterality Date   APPENDECTOMY  12/27/2005   CARDIAC CATHETERIZATION  ~ 2003   COLONOSCOPY  10/28/2022   2013   FOOT NEUROMA SURGERY     KNEE ARTHROSCOPY Left 04/09/2014   Procedure: ARTHROSCOPY KNEE;  Surgeon: Johnny Bridge, MD;  Location: [place];  Service: Orthopedics;  Laterality: Left;   NASAL SEPTUM SURGERY  12/28/1979   PARTIAL KNEE ARTHROPLASTY Left 04/09/2014   Procedure: UNICOMPARTMENTAL KNEE;  Surgeon: Johnny Bridge, MD;  Location: [place];  Service: Orthopedics;  Laterality: Left;   REPLACEMENT UNICONDYLAR JOINT KNEE Left 04/09/2014   TONSILLECTOMY AND ADENOIDECTOMY  12/27/1970    Prior to Admission medications   Medication Sig Start Date End Date Taking? Authorizing Provider  carvedilol (COREG) 12.5 MG tablet Take 12.5 mg by mouth 2 (two) times daily. 05/02/18  Yes [provider]  doxazosin (CARDURA) 4 MG tablet Take 4 mg by mouth daily. 03/26/22  Yes [provider]  hydrochlorothiazide (HYDRODIURIL) 12.5 MG tablet TAKE 1 TABLET BY MOUTH DAILY TO CONTROL BLOOD PRESSURE. 03/18/22  Yes [provider]  LIPITOR 40 MG tablet Take 40 mg by mouth daily. 04/17/12  Yes [provider]  meloxicam (MOBIC) 15 MG tablet Take 15 mg by mouth daily.   Yes [provider]  Multiple Vitamins-Minerals (CENTRUM SILVER 50+MEN) TABS Take 1 tablet by mouth daily.    Yes [provider]  valsartan (DIOVAN) 160 MG tablet Take 1 tablet (160 mg total) by mouth daily. 07/05/18  Yes Tanda Rockers, MD  EPINEPHrine 0.3 mg/0.3 mL IJ SOAJ injection epinephrine 0.3 mg/0.3 mL injection, auto-injector  INJECT AS DIRECTED FOR BEE STING REACTION 02/28/20   [provider]  fluticasone (FLONASE) 50 MCG/ACT nasal spray TAKE 1 2 SPRAYS IN EACH NOSTRIL DAILY FOR ALLERGIES AND OBSTRUCTION 03/18/22   [provider]  ibuprofen (ADVIL,MOTRIN) 800 MG tablet Take 1 tablet (800 mg total) by mouth 3 (three) times daily. 09/22/18   Martyn Ehrich, NP  tadalafil (CIALIS) 20 MG tablet Take 1/2 to one before sex as needed for  E.D. 03/18/22   [provider]    Current Outpatient Medications  Medication Sig Dispense Refill   carvedilol (COREG) 12.5 MG tablet Take 12.5 mg by mouth 2 (two) times daily.  3   doxazosin (CARDURA) 4 MG tablet Take 4 mg by mouth daily.     hydrochlorothiazide (HYDRODIURIL) 12.5 MG tablet TAKE 1 TABLET BY MOUTH DAILY TO CONTROL BLOOD PRESSURE.     LIPITOR 40 MG tablet Take 40 mg by mouth daily.     meloxicam (MOBIC) 15 MG tablet Take 15 mg by mouth daily.     Multiple Vitamins-Minerals (CENTRUM SILVER 50+MEN) TABS Take 1 tablet by mouth daily.     valsartan (DIOVAN) 160  MG tablet Take 1 tablet (160 mg total) by mouth daily. 30 tablet 11   EPINEPHrine 0.3 mg/0.3 mL IJ SOAJ injection epinephrine 0.3 mg/0.3 mL injection, auto-injector  INJECT AS DIRECTED FOR BEE STING REACTION     fluticasone (FLONASE) 50 MCG/ACT nasal spray TAKE 1 2 SPRAYS IN EACH NOSTRIL DAILY FOR ALLERGIES AND OBSTRUCTION     ibuprofen (ADVIL,MOTRIN) 800 MG tablet Take 1 tablet (800 mg total) by mouth 3 (three) times daily. 30 tablet 0   tadalafil (CIALIS) 20 MG tablet Take 1/2 to one before sex as needed for  E.D.     Current Facility-Administered Medications  Medication Dose Route Frequency Provider Last Rate Last Admin   0.9 %  sodium chloride  infusion  500 mL Intravenous Once Ladene Artist, MD        Allergies as of 10/28/2022 - Review Complete 10/28/2022  Allergen Reaction Noted   Bee venom Anaphylaxis 07/04/2017   Percocet [oxycodone-acetaminophen] Itching 04/25/2012   Sulfa antibiotics Other (See Comments) 04/25/2012    Family History  Problem Relation Age of Onset   Heart disease Mother    Heart disease Father    Heart disease Brother    Heart disease Paternal Grandfather    Colon cancer Neg Hx    Esophageal cancer Neg Hx    Rectal cancer Neg Hx    Stomach cancer Neg Hx     Social History   Socioeconomic History   Marital status: Married    Spouse name: Not on file   Number of children: Not on file   Years of education: Not on file   Highest education level: Not on file  Occupational History   Not on file  Tobacco Use   Smoking status: Former    Packs/day: 0.50    Years: 32.00    Total pack years: 16.00    Types: Cigarettes    Quit date: 2009    Years since quitting: 14.8   Smokeless tobacco: Never   Tobacco comments:    04/09/2014 "quit smoking ~ 2008"  Vaping Use   Vaping Use: Never used  Substance and Sexual Activity   Alcohol use: Yes    Alcohol/week: 3.0 standard drinks of alcohol    Types: 3 Cans of beer per week   Drug use: Never   Sexual activity: Yes  Other Topics Concern   Not on file  Social History Narrative   Not on file   Social Determinants of Health   Financial Resource Strain: Not on file  Food Insecurity: Not on file  Transportation Needs: Not on file  Physical Activity: Not on file  Stress: Not on file  Social Connections: Not on file  Intimate Partner Violence: Not on file    Review of Systems:  All systems reviewed were negative except where noted in HPI.   Physical Exam: General:  Alert, well-developed, in NAD Head:  Normocephalic and atraumatic. Eyes:  Sclera clear, no icterus.   Conjunctiva pink. Ears:  Normal auditory acuity. Mouth:  No deformity  or lesions.  Neck:  Supple; no masses . Lungs:  Clear throughout to auscultation.   No wheezes, crackles, or rhonchi. No acute distress. Heart:  Regular rate and rhythm; no murmurs. Abdomen:  Soft, nondistended, nontender. No masses, hepatomegaly. No obvious masses.  Normal bowel .    Rectal:  Deferred   Msk:  Symmetrical without gross deformities.. Pulses:  Normal pulses noted. Extremities:  Without edema. Neurologic:  Alert and  oriented x4;  grossly normal neurologically. Skin:  Intact without significant lesions or rashes. Cervical Nodes:  No significant cervical adenopathy. Psych:  Alert and cooperative. Normal mood and affect.   Impression / Plan:   Average risk CRC screening for colonoscopy.  Pricilla Riffle. Fuller Plan  10/28/2022, 10:33 AM See Shea Evans,  GI, to contact our on call provider

## 2022-10-28 NOTE — Progress Notes (Signed)
Pt's states no medical or surgical changes since previsit or office visit. 

## 2022-10-28 NOTE — Progress Notes (Signed)
Report given to PACU, vss 

## 2022-10-28 NOTE — Op Note (Signed)
[facility] Patient Name: Brandon King Procedure Date: 10/28/2022 10:27 AM MRN: [id number] Endoscopist: Meryl Dare , MD, [phone] Age: 60 Referring MD:  Date of Birth: 09/06/1961 Gender: Male Account #: [id number] Procedure:                Colonoscopy Indications:              Screening for colorectal malignant neoplasm Medicines:                Monitored Anesthesia Care Procedure:                Pre-Anesthesia Assessment:                           - Prior to the procedure, a History and Physical                            was performed, and patient medications and                            allergies were reviewed. The patient's tolerance of                            previous anesthesia was also reviewed. The risks                            and benefits of the procedure and the sedation                            options and risks were discussed with the patient.                            All questions were answered, and informed consent                            was obtained. Prior Anticoagulants: The patient has                            taken no anticoagulant or antiplatelet agents. ASA                            Grade Assessment: II - A patient with mild systemic                            disease. After reviewing the risks and benefits,                            the patient was deemed in satisfactory condition to                            undergo the procedure.                           After obtaining informed consent, the colonoscope  was passed under direct vision. Throughout the                            procedure, the patient's blood pressure, pulse, and                            oxygen saturations were monitored continuously. The                            Colonoscope was introduced through the anus and                            advanced to the the cecum, identified by                            appendiceal orifice and  ileocecal valve. The                            ileocecal valve, appendiceal orifice, and rectum                            were photographed. The quality of the bowel                            preparation was excellent. The colonoscopy was                            performed without difficulty. The patient tolerated                            the procedure well. Scope In: 10:43:51 AM Scope Out: 10:54:53 AM Scope Withdrawal Time: 0 hours 9 minutes 11 seconds  Total Procedure Duration: 0 hours 11 minutes 2 seconds  Findings:                 The perianal and digital rectal examinations were                            normal.                           Multiple small-mouthed diverticula were found in                            the left colon. There was evidence of diverticular                            spasm. Peri-diverticular erythema was seen. There                            was no evidence of diverticular bleeding.                           Internal hemorrhoids were found during  retroflexion. The hemorrhoids were small and Grade                            I (internal hemorrhoids that do not prolapse).                           The exam was otherwise without abnormality on                            direct and retroflexion views. Complications:            No immediate complications. Estimated blood loss:                            None. Estimated Blood Loss:     Estimated blood loss: none. Impression:               - Mild diverticulosis in the left colon.                           - Internal hemorrhoids.                           - The examination was otherwise normal on direct                            and retroflexion views.                           - No specimens collected. Recommendation:           - Repeat colonoscopy in 10 years for screening                            purposes.                           - Patient has a contact number available for                             emergencies. The signs and symptoms of potential                            delayed complications were discussed with the                            patient. Return to normal activities tomorrow.                            Written discharge instructions were provided to the                            patient.                           - High fiber diet.                           -  Continue present medications. Ladene Artist, MD 10/28/2022 10:58:15 AM This report has been signed electronically.

## 2022-10-28 NOTE — Patient Instructions (Signed)
Handouts Provided:  High Fiber Diet and Diverticulosis  Follow High Fiber Diet  YOU HAD AN ENDOSCOPIC PROCEDURE TODAY AT [place]:   Refer to the procedure report that was given to you for any specific questions about what was found during the examination.  If the procedure report does not answer your questions, please call your gastroenterologist to clarify.  If you requested that your care partner not be given the details of your procedure findings, then the procedure report has been included in a sealed envelope for you to review at your convenience later.  YOU SHOULD EXPECT: Some feelings of bloating in the abdomen. Passage of more gas than usual.  Walking can help get rid of the air that was put into your GI tract during the procedure and reduce the bloating. If you had a lower endoscopy (such as a colonoscopy or flexible sigmoidoscopy) you may notice spotting of blood in your stool or on the toilet paper. If you underwent a bowel prep for your procedure, you may not have a normal bowel movement for a few days.  Please Note:  You might notice some irritation and congestion in your nose or some drainage.  This is from the oxygen used during your procedure.  There is no need for concern and it should clear up in a day or so.  SYMPTOMS TO REPORT IMMEDIATELY:  Following lower endoscopy (colonoscopy or flexible sigmoidoscopy):  Excessive amounts of blood in the stool  Significant tenderness or worsening of abdominal pains  Swelling of the abdomen that is new, acute  Fever of 100F or higher  For urgent or emergent issues, a gastroenterologist can be reached at any hour by calling [phone]. Do not use MyChart messaging for urgent concerns.    DIET:  We do recommend a small meal at first, but then you may proceed to your regular diet.  Drink plenty of fluids but you should avoid alcoholic beverages for 24 hours.  ACTIVITY:  You should plan to take it easy for the rest  of today and you should NOT DRIVE or use heavy machinery until tomorrow (because of the sedation medicines used during the test).    FOLLOW UP: Our staff will call the number listed on your records the next business day following your procedure.  We will call around 7:15- 8:00 am to check on you and address any questions or concerns that you may have regarding the information given to you following your procedure. If we do not reach you, we will leave a message.     If any biopsies were taken you will be contacted by phone or by letter within the next 1-3 weeks.  Please call us at [phone] if you have not heard about the biopsies in 3 weeks.    SIGNATURES/CONFIDENTIALITY: You and/or your care partner have signed paperwork which will be entered into your electronic medical record.  These signatures attest to the fact that that the information above on your After Visit Summary has been reviewed and is understood.  Full responsibility of the confidentiality of this discharge information lies with you and/or your care-partner.

## 2022-10-29 ENCOUNTER — Telehealth: Payer: Self-pay

## 2022-10-29 NOTE — Telephone Encounter (Signed)
  Follow up Call-     10/28/2022    9:45 AM  Call back number  Post procedure Call Back phone  # [phone]  Permission to leave phone message Yes     Patient questions:  Do you have a fever, pain , or abdominal swelling? No. Pain Score  0 *  Have you tolerated food without any problems? Yes.    Have you been able to return to your normal activities? Yes.    Do you have any questions about your discharge instructions: Diet   No. Medications  No. Follow up visit  No.  Do you have questions or concerns about your Care? No.  Actions: * If pain score is 4 or above: No action needed, pain <4.

## 2023-09-05 ENCOUNTER — Ambulatory Visit
Admission: RE | Admit: 2023-09-05 | Discharge: 2023-09-05 | Disposition: A | Discharge status: Home / Self Care | Payer: BC Managed Care – PPO | Source: Ambulatory Visit

## 2023-09-05 ENCOUNTER — Ambulatory Visit: Payer: BC Managed Care – PPO

## 2023-09-05 VITALS — BP 131/76 | HR 67 | Temp 97.8°F | Resp 20 | Ht 70.0 in | Wt 200.0 lb

## 2023-09-05 DIAGNOSIS — U071 COVID-19: Secondary | ICD-10-CM

## 2023-09-05 DIAGNOSIS — R059 Cough, unspecified: Secondary | ICD-10-CM

## 2023-09-05 MED ORDER — DOXYCYCLINE HYCLATE 100 MG PO CAPS: 100.0000 mg | ORAL_CAPSULE | Freq: Two times a day (BID) | ORAL | 0 refills | Status: DC

## 2023-09-05 NOTE — ED Triage Notes (Signed)
"  I had COVID19 a few weeks ago and I can't seem to get over it". Current symptoms "nausea, zero energy and can't breath good engough". No fever.

## 2023-09-10 NOTE — ED Provider Notes (Signed)
[facility]    CSN: [id number] Arrival date & time: 09/05/23  1832      History   Chief Complaint Chief Complaint  Patient presents with   Fatigue    Shortness of breath - Entered by patient   Shortness of Breath    HPI Brandon King is a 62 y.o. male.   Patient here today for evaluation of continued cough, nausea, decreased energy level after having COVID a few weeks ago.  He states he feels as if he just cannot get over it.  He denies any vomiting.  He has not had any fever.  He has taken over-the-counter medication without resolution.  The history is provided by the patient.  Shortness of Breath Associated symptoms: cough   Associated symptoms: no abdominal pain, no ear pain, no fever, no sore throat and no vomiting     Past Medical History:  Diagnosis Date   Allergy    Arthritis    GERD (gastroesophageal reflux disease)    Hyperlipidemia    Hypertension    Kidney stones    "never had OR" (04/09/2014)   Kidney stones    Osteoarthritis of left knee 04/09/2014   Pneumonia    hx of   Sleep apnea 2017   no longer uses CPAP    Patient Active Problem List   Diagnosis Date Noted   Rib fractures 09/22/2018   Leg swelling 08/14/2018   OSA (obstructive sleep apnea) 07/06/2018   DOE (dyspnea on exertion) 07/05/2018   Nausea and vomiting 11/29/2014   CAP (community acquired pneumonia) 11/29/2014   Benign essential HTN 11/29/2014   Nausea with vomiting    Osteoarthritis of left knee 04/09/2014   Knee osteoarthritis 04/09/2014   HYPERCHOLESTEROLEMIA 03/05/2008   HYPERLIPIDEMIA 03/05/2008   INTERNAL HEMORRHOIDS 03/05/2008   GERD without esophagitis 03/05/2008   Allergic rhinitis 03/05/2008    Past Surgical History:  Procedure Laterality Date   APPENDECTOMY  12/27/2005   CARDIAC CATHETERIZATION  ~ 2003   COLONOSCOPY  10/28/2022   2013   FOOT NEUROMA SURGERY     KNEE ARTHROSCOPY Left 04/09/2014   Procedure: ARTHROSCOPY KNEE;  Surgeon: Eulas Post, MD;  Location: [facility] OR;  Service: Orthopedics;  Laterality: Left;   NASAL SEPTUM SURGERY  12/28/1979   PARTIAL KNEE ARTHROPLASTY Left 04/09/2014   Procedure: UNICOMPARTMENTAL KNEE;  Surgeon: Eulas Post, MD;  Location: [facility] OR;  Service: Orthopedics;  Laterality: Left;   REPLACEMENT UNICONDYLAR JOINT KNEE Left 04/09/2014   TONSILLECTOMY AND ADENOIDECTOMY  12/27/1970       Home Medications    Prior to Admission medications   Medication Sig Start Date End Date Taking? Authorizing Provider  carvedilol (COREG) 12.5 MG tablet Take 12.5 mg by mouth 2 (two) times daily. 05/02/18  Yes [provider]  doxazosin (CARDURA) 4 MG tablet Take 4 mg by mouth daily. 03/26/22  Yes [provider]  doxycycline (VIBRAMYCIN) 100 MG capsule Take 1 capsule (100 mg total) by mouth 2 (two) times daily. 09/05/23  Yes Tomi Bamberger, PA-C  hydrochlorothiazide (HYDRODIURIL) 12.5 MG tablet TAKE 1 TABLET BY MOUTH DAILY TO CONTROL BLOOD PRESSURE. 03/18/22  Yes [provider]  LIPITOR 40 MG tablet Take 40 mg by mouth daily. 04/17/12  Yes [provider]  meloxicam (MOBIC) 15 MG tablet Take 15 mg by mouth daily.   Yes [provider]  Multiple Vitamins-Minerals (CENTRUM SILVER 50+MEN) TABS Take 1 tablet by mouth daily.   Yes [provider]  UNKNOWN TO PATIENT Taken once dose of previous given oral antibiotic, name unknown.   Yes [provider]  valsartan (DIOVAN) 160 MG tablet Take 1 tablet (160 mg total) by mouth daily. 07/05/18  Yes Nyoka Cowden, MD  EPINEPHrine 0.3 mg/0.3 mL IJ SOAJ injection epinephrine 0.3 mg/0.3 mL injection, auto-injector  INJECT AS DIRECTED FOR BEE STING REACTION 02/28/20   [provider]  fluticasone (FLONASE) 50 MCG/ACT nasal spray TAKE 1 2 SPRAYS IN EACH NOSTRIL DAILY FOR ALLERGIES AND OBSTRUCTION 03/18/22   [provider]  ibuprofen (ADVIL,MOTRIN) 800 MG tablet Take 1 tablet (800 mg total) by mouth 3  (three) times daily. 09/22/18   Glenford Bayley, NP  tadalafil (CIALIS) 20 MG tablet Take 1/2 to one before sex as needed for  E.D. 03/18/22   [provider]    Family History Family History  Problem Relation Age of Onset   Heart disease Mother    Heart disease Father    Heart disease Brother    Heart disease Paternal Grandfather    Colon cancer Neg Hx    Esophageal cancer Neg Hx    Rectal cancer Neg Hx    Stomach cancer Neg Hx     Social History Social History   Tobacco Use   Smoking status: Former    Current packs/day: 0.00    Average packs/day: 0.5 packs/day for 32.0 years (16.0 ttl pk-yrs)    Types: Cigarettes    Start date: 55    Quit date: 2009    Years since quitting: 15.7   Smokeless tobacco: Never   Tobacco comments:    04/09/2014 "quit smoking ~ 2008"  Vaping Use   Vaping status: Never Used  Substance Use Topics   Alcohol use: Yes    Alcohol/week: 3.0 standard drinks of alcohol    Types: 3 Cans of beer per week    Comment: Rarely   Drug use: Never     Allergies   Bee venom, Sulfacetamide sodium, Clarithromycin, Hydromorphone, Percocet [oxycodone-acetaminophen], and Sulfa antibiotics   Review of Systems Review of Systems  Constitutional:  Positive for fatigue. Negative for chills and fever.  HENT:  Negative for congestion, ear pain and sore throat.   Eyes:  Negative for discharge and redness.  Respiratory:  Positive for cough. Negative for shortness of breath.   Gastrointestinal:  Negative for abdominal pain, diarrhea, nausea and vomiting.     Physical Exam Triage Vital Signs ED Triage Vitals  Encounter Vitals Group     BP 09/05/23 1845 131/76     Systolic BP Percentile --      Diastolic BP Percentile --      Pulse Rate 09/05/23 1845 67     Resp 09/05/23 1845 20     Temp 09/05/23 1845 97.8 F (36.6 C)     Temp Source 09/05/23 1845 Oral     SpO2 09/05/23 1845 98 %     Weight 09/05/23 1842 200 lb (90.7 kg)     Height 09/05/23  1842 5\' 10"  (1.778 m)     Head Circumference --      Peak Flow --      Pain Score 09/05/23 1838 0     Pain Loc --      Pain Education --      Exclude from Growth Chart --    No data found.  Updated Vital Signs BP 131/76 (BP Location: Left Arm)   Pulse 67   Temp 97.8 F (  36.6 C) (Oral)   Resp 20   Ht 5\' 10"  (1.778 m)   Wt 200 lb (90.7 kg)   SpO2 98%   BMI 28.70 kg/m      Physical Exam Vitals and nursing note reviewed.  Constitutional:      General: He is not in acute distress.    Appearance: Normal appearance. He is well-developed. He is not ill-appearing.  HENT:     Head: Normocephalic and atraumatic.     Nose: Congestion present.     Mouth/Throat:     Mouth: Mucous membranes are moist.     Pharynx: Oropharynx is clear. No oropharyngeal exudate or posterior oropharyngeal erythema.  Eyes:     Conjunctiva/sclera: Conjunctivae normal.  Cardiovascular:     Rate and Rhythm: Normal rate and regular rhythm.     Heart sounds: Normal heart sounds. No murmur heard. Pulmonary:     Effort: Pulmonary effort is normal. No respiratory distress.     Breath sounds: Normal breath sounds. No wheezing, rhonchi or rales.  Skin:    General: Skin is warm and dry.  Neurological:     Mental Status: He is alert.  Psychiatric:        Mood and Affect: Mood normal.        Thought Content: Thought content normal.      UC Treatments / Results  Labs (all labs ordered are listed, but only abnormal results are displayed) Labs Reviewed - No data to display  EKG   Radiology No results found.  Procedures Procedures (including critical care time)  Medications Ordered in UC Medications - No data to display  Initial Impression / Assessment and Plan / UC Course  I have reviewed the triage vital signs and the nursing notes.  Pertinent labs & imaging results that were available during my care of the patient were reviewed by me and considered in my medical decision making (see chart for  details).    Given continued symptoms after COVID-19 illness will treat with antibiotics to cover possible developing secondary pneumonia.  X-ray ordered without obvious pneumonia noted.  Encouraged follow-up if no gradual improvement or with any further concerns or worsening symptoms.  Final Clinical Impressions(s) / UC Diagnoses   Final diagnoses:  COVID-19   Discharge Instructions   None    ED Prescriptions     Medication Sig Dispense Auth. Provider   doxycycline (VIBRAMYCIN) 100 MG capsule Take 1 capsule (100 mg total) by mouth 2 (two) times daily. 20 capsule Tomi Bamberger, PA-C      PDMP not reviewed this encounter.   Tomi Bamberger, PA-C 09/10/23 1456

## 2023-12-09 ENCOUNTER — Telehealth: Payer: Self-pay | Admitting: Internal Medicine

## 2023-12-09 NOTE — Telephone Encounter (Signed)
Called and spoke to patient regarding upcoming appointment, No labs are needed prior to this appointment

## 2023-12-09 NOTE — Telephone Encounter (Signed)
Patient has appt on 1/24, seeing if he needs to have labs done prior to. Please advise

## 2023-12-27 ENCOUNTER — Encounter: Payer: Self-pay | Admitting: Urology

## 2023-12-27 ENCOUNTER — Other Ambulatory Visit
Admission: RE | Admit: 2023-12-27 | Discharge: 2023-12-27 | Disposition: A | Discharge status: Home / Self Care | Payer: BC Managed Care – PPO | Attending: Urology | Admitting: Urology

## 2023-12-27 ENCOUNTER — Other Ambulatory Visit: Payer: Self-pay

## 2023-12-27 ENCOUNTER — Ambulatory Visit (INDEPENDENT_AMBULATORY_CARE_PROVIDER_SITE_OTHER): Payer: BC Managed Care – PPO | Admitting: Urology

## 2023-12-27 ENCOUNTER — Telehealth: Payer: Self-pay | Admitting: Urology

## 2023-12-27 DIAGNOSIS — R3911 Hesitancy of micturition: Secondary | ICD-10-CM | POA: Insufficient documentation

## 2023-12-27 DIAGNOSIS — N401 Enlarged prostate with lower urinary tract symptoms: Secondary | ICD-10-CM

## 2023-12-27 LAB — URINALYSIS, COMPLETE (UACMP) WITH MICROSCOPIC
Bilirubin Urine: NEGATIVE
Glucose, UA: NEGATIVE mg/dL
Hgb urine dipstick: NEGATIVE
Ketones, ur: NEGATIVE mg/dL
Leukocytes,Ua: NEGATIVE
Nitrite: NEGATIVE
Protein, ur: NEGATIVE mg/dL
Specific Gravity, Urine: 1.015 (ref 1.005–1.030)
pH: 7 (ref 5.0–8.0)

## 2023-12-27 LAB — BLADDER SCAN AMB NON-IMAGING

## 2023-12-27 NOTE — Progress Notes (Signed)
 12/27/23 1:39 PM   Brandon King Jasmine [birth year] [id number]  CC: BPH/LUTS, ED  HPI: 62 year old male who reports a single episode of straining and weak urinary stream that correlated with taking an over-the-counter cold medicine.  He resumed Flomax he previously had from a kidney stone event which improved his urinary symptoms, this was refilled by PCP.  He has bothersome retrograde ejaculation and difficulty reaching orgasm on the Flomax.  He denies any significant urinary complaints today, IPSS score is 7, urinalysis is benign, PVR is normal at 56ml.  He denies any dysuria or gross hematuria.  Recent PSA from 12/09/2023 was normal at 0.71, and stable from prior values.  Creatinine 1.06, EGFR greater than 60.  Prostate measured 30 g on CT from 2019.  PMH: Past Medical History:  Diagnosis Date   Allergy    Arthritis    GERD (gastroesophageal reflux disease)    Hyperlipidemia    Hypertension    Kidney stones    never had OR (04/09/2014)   Kidney stones    Osteoarthritis of left knee 04/09/2014   Pneumonia    hx of   Sleep apnea 2017   no longer uses CPAP    Surgical History: Past Surgical History:  Procedure Laterality Date   APPENDECTOMY  12/27/2005   CARDIAC CATHETERIZATION  ~ 2003   COLONOSCOPY  10/28/2022   2013   FOOT NEUROMA SURGERY     KNEE ARTHROSCOPY Left 04/09/2014   Procedure: ARTHROSCOPY KNEE;  Surgeon: Fonda SHAUNNA Olmsted, MD;  Location: [facility] OR;  Service: Orthopedics;  Laterality: Left;   NASAL SEPTUM SURGERY  12/28/1979   PARTIAL KNEE ARTHROPLASTY Left 04/09/2014   Procedure: UNICOMPARTMENTAL KNEE;  Surgeon: Fonda SHAUNNA Olmsted, MD;  Location: [facility] OR;  Service: Orthopedics;  Laterality: Left;   REPLACEMENT UNICONDYLAR JOINT KNEE Left 04/09/2014   TONSILLECTOMY AND ADENOIDECTOMY  12/27/1970    Family History: Family History  Problem Relation Age of Onset   Heart disease Mother    Heart disease Father    Heart disease Brother    Heart disease Paternal  Grandfather    Colon cancer Neg Hx    Esophageal cancer Neg Hx    Rectal cancer Neg Hx    Stomach cancer Neg Hx     Social History:  reports that he quit smoking about 16 years ago. His smoking use included cigarettes. He started smoking about 48 years ago. He has a 16 pack-year smoking history. He has been exposed to tobacco smoke. He has never used smokeless tobacco. He reports current alcohol use of about 3.0 standard drinks of alcohol per week. He reports that he does not use drugs.  Physical Exam: BP (!) 165/88 (BP Location: Left Arm, Patient Position: Sitting, Cuff Size: Large)   Pulse 66   Ht 5' 10 (1.778 m)   Wt 201 lb (91.2 kg)   BMI 28.84 kg/m    Constitutional:  Alert and oriented, No acute distress. Cardiovascular: No clubbing, cyanosis, or edema. Respiratory: Normal respiratory effort, no increased work of breathing. GI: Abdomen is soft, nontender, nondistended, no abdominal masses   Laboratory Data: Reviewed, see HPI  Pertinent Imaging: I have personally viewed and interpreted the CT from 2019 showing a 30 g prostate.  Assessment & Plan:   62 year old male with single episode of weak urinary stream and straining associated with over-the-counter cold medicine.  We discussed the impact on prostate tone, and that this likely explains his symptoms.  He is bothered by the retrograde ejaculation  on the Flomax.  I recommended stopping the Flomax and monitoring his urinary symptoms.  If urinary symptoms recur or worsen, can certainly resume the Flomax, or consider cystoscopy in the future for further evaluation and consideration of an outlet procedure like UroLift that would maintain ejaculatory function.  Counseled to avoid over-the-counter cold medicines that could exacerbate urinary symptoms, he could consider using the Flomax on an as-needed basis if he does take those medications.  Trial off Flomax RTC 3 to 4 months symptom check   Redell Burnet, MD 12/27/2023  [facility] [address], [address] [place], [place] [address] [phone]

## 2023-12-27 NOTE — Telephone Encounter (Signed)
Pt saw Sninsky earlier today and forgot to ask if he stops Flomax, should he go back on Doxazosin 4 mg prescribed by pcp?

## 2023-12-27 NOTE — Telephone Encounter (Signed)
Please advise 

## 2024-01-20 ENCOUNTER — Ambulatory Visit: Payer: BC Managed Care – PPO | Attending: Internal Medicine | Admitting: Internal Medicine

## 2024-01-20 ENCOUNTER — Encounter: Payer: Self-pay | Admitting: Internal Medicine

## 2024-01-20 VITALS — BP 154/80 | HR 61 | Wt 209.0 lb

## 2024-01-20 DIAGNOSIS — I2510 Atherosclerotic heart disease of native coronary artery without angina pectoris: Secondary | ICD-10-CM

## 2024-01-20 DIAGNOSIS — R0609 Other forms of dyspnea: Secondary | ICD-10-CM | POA: Diagnosis not present

## 2024-01-20 DIAGNOSIS — E7800 Pure hypercholesterolemia, unspecified: Secondary | ICD-10-CM

## 2024-01-20 DIAGNOSIS — I10 Essential (primary) hypertension: Secondary | ICD-10-CM

## 2024-01-20 DIAGNOSIS — Z79899 Other long term (current) drug therapy: Secondary | ICD-10-CM

## 2024-01-20 DIAGNOSIS — G4733 Obstructive sleep apnea (adult) (pediatric): Secondary | ICD-10-CM

## 2024-01-20 MED ORDER — DOXAZOSIN MESYLATE 4 MG PO TABS: 4.0000 mg | ORAL_TABLET | Freq: Every day | ORAL | 3 refills | Status: AC

## 2024-01-20 NOTE — Patient Instructions (Signed)
Medication Instructions:  You can resume Doxazosin (Cardura) 4mg  once daily *If you need a refill on your cardiac medications before your next appointment, please call your pharmacy*  Lab Work: None  Testing/Procedures: Your physician has requested that you have an echocardiogram. Echocardiography is a painless test that uses sound waves to create images of your heart. It provides your doctor with information about the size and shape of your heart and how well your heart's chambers and valves are working. This procedure takes approximately one hour. There are no restrictions for this procedure. Please do NOT wear cologne, perfume, aftershave, or lotions (deodorant is allowed). Please arrive 15 minutes prior to your appointment time. This will take place at [address] [place]    Follow-Up: At [facility], you and your health needs are our priority.  As part of our continuing mission to provide you with exceptional heart care, we have created designated Provider Care Teams.  These Care Teams include your primary Cardiologist (physician) and Advanced Practice Providers (APPs -  Physician Assistants and Nurse Practitioners) who all work together to provide you with the care you need, when you need it.  Your next appointment:   04/19/2024 (Thursday) at 9:20 am  Provider:   Parke Poisson, MD

## 2024-01-20 NOTE — Progress Notes (Addendum)
Cardiology Office Note:  .   Date:  01/20/2024  ID:  Brandon King, DOB 13-May-1961, MRN [id number] PCP: Barbie Banner, MD  [facility] Providers Cardiologist:  Parke Poisson, MD    History of Present Illness: .   Brandon King is a 63 y.o. male.  Discussed the use of AI scribe software for clinical note transcription with the patient, who gave verbal consent to proceed.  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 The patient's blood pressure has been running high, often in the 150s to 160s, despite being on carvedilol, hydrochlorothiazide, and valsartan. The patient was also on doxazosin, but stopped taking it due to a prostate issue. The patient has had COVID-19 three times, with the last episode being the most severe and causing some breathing difficulties. The patient reports feeling winded quickly, especially when doing physical activities such as climbing stairs. 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 The patient is considering going back on doxazosin for blood pressure management and has Flomax on hand for any prostate issues.        ROS: negative except per HPI above.  Studies Reviewed: .        Results   RADIOLOGY Chest X-ray: Mild pulmonary opacity  DIAGNOSTIC EKG: Normal (08/2023) Cardiac catheterization: Minimal coronary artery blockage >10 years ago.     Risk Assessment/Calculations:      Physical Exam:   VS:  BP (!) 154/80   Pulse 61   Wt 209 lb (94.8 kg)   SpO2 95%   BMI 29.99 kg/m    Wt Readings from Last 3 Encounters:  01/20/24 209 lb (94.8 kg)  12/27/23 201 lb (91.2 kg)  09/05/23 200 lb (90.7 kg)     Physical Exam   VITALS: BP- 154/80 CHEST: Lungs clear to auscultation. CARDIOVASCULAR: Heart sounds normal.     GEN: Well  nourished, well developed in no acute distress NECK: No JVD; No carotid bruits CARDIAC: RRR, no murmurs, rubs, gallops RESPIRATORY:  Clear to auscultation without rales, wheezing or rhonchi  ABDOMEN: Soft, non-tender, non-distended EXTREMITIES:  No edema; No deformity   ASSESSMENT AND PLAN: .    Assessment & Plan DOE (dyspnea on exertion)  Primary hypertension  Coronary artery calcification  HYPERCHOLESTEROLEMIA  OSA (obstructive sleep apnea)  Medication management   Assessment and Plan    Hypertension Blood pressure elevated at 154/80, despite current regimen of carvedilol 12.5mg  BID, hydrochlorothiazide 12.5mg  daily, and valsartan 160mg  daily. Patient had been on doxazosin, but it was stopped due to a prostate issue. No adverse effects reported with doxazosin previously. -Resume doxazosin 4mg  daily. -Monitor blood pressure closely at home. -Schedule follow-up visit in three months to reassess blood pressure control and potentially increase doxazosin to 8mg  if necessary.  Benign Prostatic Hyperplasia Recent episode of urinary retention, treated with tamsulosin (Flomax). Currently not taking tamsulosin or doxazosin. -If prostate symptoms recur and tamsulosin is restarted, would avoid taking doxazosin at same time.   Dyspnea on Exertion Patient reports increased shortness of breath since recovering from COVID-19 in the fall. No chest pain reported. Previous stress tests and heart catheterization showed nonobstructive coronary disease. -Order echocardiogram to assess cardiac function post-COVID-19. -Consider future stress test if shortness of breath worsens or does not improve.  COVID-19 Recovered from severe episode, treated with Paxlovid. Secondary lung infection treated with antibiotics. -No further action needed at this time, continue to monitor recovery.

## 2024-02-09 ENCOUNTER — Other Ambulatory Visit: Payer: Self-pay | Admitting: Physical Medicine & Rehabilitation

## 2024-02-09 DIAGNOSIS — G8929 Other chronic pain: Secondary | ICD-10-CM

## 2024-02-10 ENCOUNTER — Ambulatory Visit (HOSPITAL_COMMUNITY): Payer: BC Managed Care – PPO

## 2024-03-27 ENCOUNTER — Ambulatory Visit: Payer: Self-pay | Admitting: Urology

## 2024-04-19 ENCOUNTER — Ambulatory Visit: Payer: BC Managed Care – PPO | Admitting: Internal Medicine

## 2024-04-26 ENCOUNTER — Encounter (HOSPITAL_COMMUNITY): Payer: Self-pay | Admitting: Internal Medicine

## 2025-01-20 ENCOUNTER — Other Ambulatory Visit: Payer: Self-pay | Admitting: Internal Medicine
